# Patient Record
Sex: Female | Born: 1960 | Race: White | Hispanic: No | Marital: Single | Smoking: Current every day smoker
Health system: Southern US, Community
[De-identification: ages and names within clinical notes are randomized; demographics above are authoritative.]

## PROBLEM LIST (undated history)

## (undated) DIAGNOSIS — I252 Old myocardial infarction: Secondary | ICD-10-CM

## (undated) DIAGNOSIS — C73 Malignant neoplasm of thyroid gland: Secondary | ICD-10-CM

## (undated) DIAGNOSIS — E890 Postprocedural hypothyroidism: Secondary | ICD-10-CM

## (undated) DIAGNOSIS — T782XXA Anaphylactic shock, unspecified, initial encounter: Secondary | ICD-10-CM

## (undated) DIAGNOSIS — I219 Acute myocardial infarction, unspecified: Secondary | ICD-10-CM

## (undated) HISTORY — DX: Hypocalcemia: E83.51

## (undated) HISTORY — DX: Old myocardial infarction: I25.2

## (undated) HISTORY — DX: Acute myocardial infarction, unspecified: I21.9

## (undated) HISTORY — DX: Postprocedural hypothyroidism: E89.0

## (undated) HISTORY — DX: Malignant neoplasm of thyroid gland: C73

## (undated) HISTORY — DX: Anaphylactic shock, unspecified, initial encounter: T78.2XXA

---

## 1998-01-18 ENCOUNTER — Other Ambulatory Visit: Admission: RE | Admit: 1998-01-18 | Discharge: 1998-01-18 | Payer: Self-pay | Admitting: Internal Medicine

## 1998-08-28 ENCOUNTER — Emergency Department (HOSPITAL_COMMUNITY): Admission: EM | Admit: 1998-08-28 | Discharge: 1998-08-28 | Payer: Self-pay | Admitting: Emergency Medicine

## 1998-09-10 ENCOUNTER — Encounter: Payer: Self-pay | Admitting: Internal Medicine

## 1998-09-10 ENCOUNTER — Ambulatory Visit (HOSPITAL_COMMUNITY): Admission: RE | Admit: 1998-09-10 | Discharge: 1998-09-10 | Payer: Self-pay | Admitting: Internal Medicine

## 1998-09-11 ENCOUNTER — Encounter: Payer: Self-pay | Admitting: Internal Medicine

## 1998-09-11 ENCOUNTER — Ambulatory Visit (HOSPITAL_COMMUNITY): Admission: RE | Admit: 1998-09-11 | Discharge: 1998-09-11 | Payer: Self-pay | Admitting: Internal Medicine

## 1998-09-18 ENCOUNTER — Other Ambulatory Visit: Admission: RE | Admit: 1998-09-18 | Discharge: 1998-09-18 | Payer: Self-pay | Admitting: Internal Medicine

## 1998-10-26 ENCOUNTER — Encounter: Payer: Self-pay | Admitting: Otolaryngology

## 1998-10-30 ENCOUNTER — Ambulatory Visit (HOSPITAL_COMMUNITY): Admission: RE | Admit: 1998-10-30 | Discharge: 1998-10-31 | Payer: Self-pay | Admitting: Otolaryngology

## 1999-02-01 ENCOUNTER — Emergency Department (HOSPITAL_COMMUNITY): Admission: EM | Admit: 1999-02-01 | Discharge: 1999-02-01 | Payer: Self-pay

## 1999-02-24 ENCOUNTER — Emergency Department (HOSPITAL_COMMUNITY): Admission: EM | Admit: 1999-02-24 | Discharge: 1999-02-24 | Payer: Self-pay | Admitting: *Deleted

## 1999-09-06 ENCOUNTER — Encounter: Payer: Self-pay | Admitting: *Deleted

## 1999-09-06 ENCOUNTER — Encounter: Admission: RE | Admit: 1999-09-06 | Discharge: 1999-09-06 | Payer: Self-pay | Admitting: *Deleted

## 2000-01-18 ENCOUNTER — Emergency Department (HOSPITAL_COMMUNITY): Admission: EM | Admit: 2000-01-18 | Discharge: 2000-01-18 | Payer: Self-pay | Admitting: Emergency Medicine

## 2000-02-10 ENCOUNTER — Encounter: Payer: Self-pay | Admitting: Family Medicine

## 2000-02-10 ENCOUNTER — Encounter: Admission: RE | Admit: 2000-02-10 | Discharge: 2000-02-10 | Payer: Self-pay | Admitting: Family Medicine

## 2000-06-19 ENCOUNTER — Encounter: Payer: Self-pay | Admitting: Emergency Medicine

## 2000-06-19 ENCOUNTER — Emergency Department (HOSPITAL_COMMUNITY): Admission: EM | Admit: 2000-06-19 | Discharge: 2000-06-19 | Payer: Self-pay | Admitting: Emergency Medicine

## 2000-06-23 ENCOUNTER — Emergency Department (HOSPITAL_COMMUNITY): Admission: EM | Admit: 2000-06-23 | Discharge: 2000-06-23 | Payer: Self-pay | Admitting: Emergency Medicine

## 2000-09-16 ENCOUNTER — Emergency Department (HOSPITAL_COMMUNITY): Admission: EM | Admit: 2000-09-16 | Discharge: 2000-09-16 | Payer: Self-pay | Admitting: *Deleted

## 2002-04-05 ENCOUNTER — Encounter: Payer: Self-pay | Admitting: Family Medicine

## 2002-04-05 ENCOUNTER — Ambulatory Visit (HOSPITAL_COMMUNITY): Admission: RE | Admit: 2002-04-05 | Discharge: 2002-04-05 | Payer: Self-pay | Admitting: Family Medicine

## 2002-05-18 ENCOUNTER — Emergency Department (HOSPITAL_COMMUNITY): Admission: EM | Admit: 2002-05-18 | Discharge: 2002-05-18 | Payer: Self-pay | Admitting: Emergency Medicine

## 2003-01-28 ENCOUNTER — Emergency Department (HOSPITAL_COMMUNITY): Admission: EM | Admit: 2003-01-28 | Discharge: 2003-01-28 | Payer: Self-pay | Admitting: Emergency Medicine

## 2003-03-29 ENCOUNTER — Encounter: Admission: RE | Admit: 2003-03-29 | Discharge: 2003-03-29 | Payer: Self-pay | Admitting: Family Medicine

## 2003-03-29 ENCOUNTER — Encounter: Payer: Self-pay | Admitting: Family Medicine

## 2003-04-06 ENCOUNTER — Ambulatory Visit (HOSPITAL_COMMUNITY): Admission: RE | Admit: 2003-04-06 | Discharge: 2003-04-06 | Payer: Self-pay | Admitting: Urology

## 2004-07-24 ENCOUNTER — Emergency Department (HOSPITAL_COMMUNITY): Admission: EM | Admit: 2004-07-24 | Discharge: 2004-07-24 | Payer: Self-pay | Admitting: Emergency Medicine

## 2006-12-27 ENCOUNTER — Emergency Department (HOSPITAL_COMMUNITY): Admission: EM | Admit: 2006-12-27 | Discharge: 2006-12-28 | Payer: Self-pay | Admitting: Emergency Medicine

## 2007-03-01 ENCOUNTER — Emergency Department (HOSPITAL_COMMUNITY): Admission: EM | Admit: 2007-03-01 | Discharge: 2007-03-01 | Payer: Self-pay | Admitting: Emergency Medicine

## 2007-03-09 ENCOUNTER — Emergency Department (HOSPITAL_COMMUNITY): Admission: EM | Admit: 2007-03-09 | Discharge: 2007-03-09 | Payer: Self-pay | Admitting: *Deleted

## 2007-04-13 ENCOUNTER — Encounter: Payer: Self-pay | Admitting: Endocrinology

## 2007-04-13 ENCOUNTER — Ambulatory Visit: Payer: Self-pay | Admitting: Endocrinology

## 2007-04-13 ENCOUNTER — Encounter: Payer: Self-pay | Admitting: *Deleted

## 2007-04-13 DIAGNOSIS — T782XXA Anaphylactic shock, unspecified, initial encounter: Secondary | ICD-10-CM | POA: Insufficient documentation

## 2007-04-13 DIAGNOSIS — E890 Postprocedural hypothyroidism: Secondary | ICD-10-CM

## 2007-04-13 DIAGNOSIS — C73 Malignant neoplasm of thyroid gland: Secondary | ICD-10-CM | POA: Insufficient documentation

## 2007-04-13 HISTORY — DX: Postprocedural hypothyroidism: E89.0

## 2007-04-13 HISTORY — DX: Malignant neoplasm of thyroid gland: C73

## 2007-04-13 HISTORY — DX: Anaphylactic shock, unspecified, initial encounter: T78.2XXA

## 2007-04-13 LAB — CONVERTED CEMR LAB
BUN: 14 mg/dL (ref 6–23)
CO2: 28 meq/L (ref 19–32)
Calcium: 9 mg/dL (ref 8.4–10.5)
Chloride: 101 meq/L (ref 96–112)
Creatinine, Ser: 1 mg/dL (ref 0.4–1.2)
GFR calc Af Amer: 77 mL/min
GFR calc non Af Amer: 64 mL/min
Glucose, Bld: 94 mg/dL (ref 70–99)
Potassium: 3.7 meq/L (ref 3.5–5.1)
Sodium: 137 meq/L (ref 135–145)
TSH: 87.56 microintl units/mL — ABNORMAL HIGH (ref 0.35–5.50)

## 2007-04-20 ENCOUNTER — Encounter: Admission: RE | Admit: 2007-04-20 | Discharge: 2007-04-20 | Payer: Self-pay | Admitting: Endocrinology

## 2007-04-20 ENCOUNTER — Encounter: Payer: Self-pay | Admitting: Endocrinology

## 2007-05-04 ENCOUNTER — Encounter: Admission: RE | Admit: 2007-05-04 | Discharge: 2007-05-04 | Payer: Self-pay | Admitting: Endocrinology

## 2007-05-04 ENCOUNTER — Encounter (INDEPENDENT_AMBULATORY_CARE_PROVIDER_SITE_OTHER): Payer: Self-pay | Admitting: Interventional Radiology

## 2007-05-04 ENCOUNTER — Other Ambulatory Visit: Admission: RE | Admit: 2007-05-04 | Discharge: 2007-05-04 | Payer: Self-pay | Admitting: Interventional Radiology

## 2007-05-10 ENCOUNTER — Encounter (INDEPENDENT_AMBULATORY_CARE_PROVIDER_SITE_OTHER): Payer: Self-pay | Admitting: *Deleted

## 2007-06-03 ENCOUNTER — Encounter: Payer: Self-pay | Admitting: Endocrinology

## 2007-06-14 ENCOUNTER — Encounter (HOSPITAL_COMMUNITY): Admission: RE | Admit: 2007-06-14 | Discharge: 2007-07-08 | Payer: Self-pay | Admitting: Otolaryngology

## 2007-07-16 ENCOUNTER — Encounter (HOSPITAL_COMMUNITY): Admission: RE | Admit: 2007-07-16 | Discharge: 2007-10-14 | Payer: Self-pay | Admitting: Otolaryngology

## 2007-08-13 ENCOUNTER — Encounter: Payer: Self-pay | Admitting: Endocrinology

## 2007-08-13 ENCOUNTER — Encounter (INDEPENDENT_AMBULATORY_CARE_PROVIDER_SITE_OTHER): Payer: Self-pay | Admitting: Otolaryngology

## 2007-08-13 ENCOUNTER — Ambulatory Visit (HOSPITAL_COMMUNITY): Admission: RE | Admit: 2007-08-13 | Discharge: 2007-08-13 | Payer: Self-pay | Admitting: Otolaryngology

## 2007-08-23 ENCOUNTER — Ambulatory Visit: Payer: Self-pay | Admitting: Endocrinology

## 2007-10-07 ENCOUNTER — Encounter: Admission: RE | Admit: 2007-10-07 | Discharge: 2007-10-07 | Payer: Self-pay | Admitting: Family Medicine

## 2007-10-18 ENCOUNTER — Encounter: Admission: RE | Admit: 2007-10-18 | Discharge: 2007-10-18 | Payer: Self-pay | Admitting: Family Medicine

## 2007-11-11 ENCOUNTER — Ambulatory Visit: Payer: Self-pay | Admitting: Endocrinology

## 2007-11-12 ENCOUNTER — Telehealth: Payer: Self-pay | Admitting: Endocrinology

## 2007-11-12 ENCOUNTER — Telehealth (INDEPENDENT_AMBULATORY_CARE_PROVIDER_SITE_OTHER): Payer: Self-pay | Admitting: *Deleted

## 2007-11-12 LAB — CONVERTED CEMR LAB: TSH: 18.69 microintl units/mL — ABNORMAL HIGH (ref 0.35–5.50)

## 2008-01-25 ENCOUNTER — Ambulatory Visit: Payer: Self-pay | Admitting: Endocrinology

## 2008-01-26 LAB — CONVERTED CEMR LAB: TSH: 2.67 microintl units/mL (ref 0.35–5.50)

## 2008-08-03 ENCOUNTER — Encounter: Admission: RE | Admit: 2008-08-03 | Discharge: 2008-08-03 | Payer: Self-pay | Admitting: Family Medicine

## 2009-01-02 ENCOUNTER — Ambulatory Visit: Payer: Self-pay | Admitting: Endocrinology

## 2009-01-02 LAB — CONVERTED CEMR LAB: TSH: 5 microintl units/mL (ref 0.35–5.50)

## 2009-06-15 ENCOUNTER — Emergency Department (HOSPITAL_COMMUNITY): Admission: EM | Admit: 2009-06-15 | Discharge: 2009-06-15 | Payer: Self-pay | Admitting: Emergency Medicine

## 2009-09-30 ENCOUNTER — Emergency Department (HOSPITAL_BASED_OUTPATIENT_CLINIC_OR_DEPARTMENT_OTHER): Admission: EM | Admit: 2009-09-30 | Discharge: 2009-09-30 | Payer: Self-pay | Admitting: Emergency Medicine

## 2010-02-08 ENCOUNTER — Ambulatory Visit: Payer: Self-pay | Admitting: Endocrinology

## 2010-02-08 HISTORY — DX: Hypocalcemia: E83.51

## 2010-02-08 LAB — CONVERTED CEMR LAB: TSH: 0.98 microintl units/mL (ref 0.35–5.50)

## 2010-02-13 LAB — CONVERTED CEMR LAB
Calcium, Total (PTH): 9.5 mg/dL (ref 8.4–10.5)
PTH: 37.5 pg/mL (ref 14.0–72.0)
Thyroglobulin Ab: <20 (ref <40.0)
Thyroperoxidase Ab SerPl-aCnc: 39.8 — ABNORMAL HIGH (ref <35.0)

## 2010-07-30 NOTE — Assessment & Plan Note (Signed)
Summary: F/U APPT/#/CD   Vital Signs:  Patient profile:   50 year old female Height:      63 inches (160.02 cm) Weight:      242.13 pounds (110.06 kg) BMI:     43.05 O2 Sat:      97 % on Room air Temp:     98.5 degrees F (36.94 degrees C) oral Pulse rate:   77 / minute BP sitting:   124 / 82  (left arm) Cuff size:   large  Vitals Entered By: Brenton Grills MA (February 08, 2010 10:54 AM)  O2 Flow:  Room air CC: F/U appt/aj   Referring Provider:  a. ross  CC:  F/U appt/aj.  History of Present Illness: pt has h/o stage-1 papillary adenocarcinoma of the thyroid.  she feels well in general.  denies cramps. she does not notice any nodule at the neck or elsewhere.  Current Medications (verified): 1)  Paxil 30 Mg  Tabs (Paroxetine Hcl) .... Take 1 By Mouth Qd 2)  Synthroid 150 Mcg  Tabs (Levothyroxine Sodium) .... Qd  Allergies (verified): 1)  ! Penicillin 2)  ! 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    Physical Exam  General:  normal appearance.   Neck:  a healed scar is present.  i do not appreciate a nodule in the thyroid or elsewhere in the neck  Additional Exam:  FastTSH                   0.98 uIU/mL  Thyroglobulin Antibody     <20.0 U/mL                  <40.0 Thyroglobulin             <0.2 ng/mL  Parathyroid Hormone       37.5 pg/mL                  14.0-72.0 Calcium                   9.5 mg/dL         Impression & Recommendations:  Problem # 1:  NEOPLASM,  MALIGNANT, THYROID GLAND (ICD-193) stage 1 no evidence of recurrence  Problem # 2:  HYPOTHYROIDISM, POSTSURGICAL (ICD-244.0) needs increased rx in view of #1  Problem # 3:  HYPOCALCEMIA (ICD-275.41) Assessment: Improved  Medications Added to Medication List This Visit: 1)  Synthroid 175 Mcg Tabs (Levothyroxine sodium) .Marland Kitchen.. 1 tab once daily  Other Orders: T-Thyroglobulin Panel (24401, [phone]) T-Parathyroid Hormone, Intact w/ Calcium (03474-25956) TLB-TSH (Thyroid Stimulating Hormone) (84443-TSH) Est. Patient Level III (38756)  Patient Instructions: 1)  return 1 year 2)  blood tests are being ordered for you today.  please call [phone] to hear your test results. 3)  pending the test results, please continue the same medications for now. 4)  (update: i left message on phone-tree:  rx as we discussed) Prescriptions: SYNTHROID 175 MCG TABS (LEVOTHYROXINE SODIUM) 1 tab once daily  #30 x 11   Entered and  Authorized by:   Minus Breeding MD   Signed by:   Minus Breeding MD on 02/10/2010   Method used:   [phone]       [address]       [place], [place]  [address]       Ph: [phone] or [phone]       Fax: [phone]   RxID:   [id number]

## 2010-08-30 ENCOUNTER — Emergency Department (HOSPITAL_BASED_OUTPATIENT_CLINIC_OR_DEPARTMENT_OTHER)
Admission: EM | Admit: 2010-08-30 | Discharge: 2010-08-30 | Disposition: A | Discharge status: Home / Self Care | Payer: BC Managed Care – PPO | Source: Home / Self Care

## 2010-08-30 ENCOUNTER — Observation Stay (HOSPITAL_COMMUNITY)
Admission: EM | Admit: 2010-08-30 | Discharge: 2010-09-01 | Disposition: A | Discharge status: Home / Self Care | Payer: BC Managed Care – PPO | Attending: Family Medicine | Admitting: Family Medicine

## 2010-08-30 ENCOUNTER — Emergency Department (HOSPITAL_BASED_OUTPATIENT_CLINIC_OR_DEPARTMENT_OTHER): Admission: EM | Admit: 2010-08-30 | Payer: BC Managed Care – PPO | Source: Home / Self Care

## 2010-08-30 DIAGNOSIS — T370X5A Adverse effect of sulfonamides, initial encounter: Secondary | ICD-10-CM | POA: Insufficient documentation

## 2010-08-30 DIAGNOSIS — E039 Hypothyroidism, unspecified: Secondary | ICD-10-CM | POA: Insufficient documentation

## 2010-08-30 DIAGNOSIS — R0602 Shortness of breath: Secondary | ICD-10-CM | POA: Insufficient documentation

## 2010-08-30 DIAGNOSIS — Z85850 Personal history of malignant neoplasm of thyroid: Secondary | ICD-10-CM | POA: Insufficient documentation

## 2010-08-30 DIAGNOSIS — T361X5A Adverse effect of cephalosporins and other beta-lactam antibiotics, initial encounter: Secondary | ICD-10-CM | POA: Insufficient documentation

## 2010-08-30 DIAGNOSIS — L02519 Cutaneous abscess of unspecified hand: Secondary | ICD-10-CM | POA: Insufficient documentation

## 2010-08-30 DIAGNOSIS — F17200 Nicotine dependence, unspecified, uncomplicated: Secondary | ICD-10-CM | POA: Insufficient documentation

## 2010-08-30 DIAGNOSIS — L500 Allergic urticaria: Principal | ICD-10-CM | POA: Insufficient documentation

## 2010-08-30 DIAGNOSIS — L03019 Cellulitis of unspecified finger: Secondary | ICD-10-CM | POA: Insufficient documentation

## 2010-08-30 DIAGNOSIS — F411 Generalized anxiety disorder: Secondary | ICD-10-CM | POA: Insufficient documentation

## 2010-08-30 DIAGNOSIS — E785 Hyperlipidemia, unspecified: Secondary | ICD-10-CM | POA: Insufficient documentation

## 2010-08-30 LAB — DIFFERENTIAL
Basophils Absolute: 0 10*3/uL (ref 0.0–0.1)
Basophils Relative: 0 % (ref 0–1)
Eosinophils Absolute: 0 10*3/uL (ref 0.0–0.7)
Eosinophils Relative: 0 % (ref 0–5)
Lymphocytes Relative: 7 % — ABNORMAL LOW (ref 12–46)
Lymphs Abs: 1.1 10*3/uL (ref 0.7–4.0)
Monocytes Absolute: 0.6 10*3/uL (ref 0.1–1.0)
Monocytes Relative: 4 % (ref 3–12)
Neutro Abs: 13 10*3/uL — ABNORMAL HIGH (ref 1.7–7.7)
Neutrophils Relative %: 88 % — ABNORMAL HIGH (ref 43–77)

## 2010-08-30 LAB — CBC
HCT: 41.2 % (ref 36.0–46.0)
Hemoglobin: 13.8 g/dL (ref 12.0–15.0)
MCH: 31.9 pg (ref 26.0–34.0)
MCHC: 33.5 g/dL (ref 30.0–36.0)
MCV: 95.2 fL (ref 78.0–100.0)
Platelets: 201 10*3/uL (ref 150–400)
RBC: 4.33 MIL/uL (ref 3.87–5.11)
RDW: 12.6 % (ref 11.5–15.5)
WBC: 14.8 10*3/uL — ABNORMAL HIGH (ref 4.0–10.5)

## 2010-08-31 LAB — CBC
HCT: 37.3 % (ref 36.0–46.0)
Hemoglobin: 12.4 g/dL (ref 12.0–15.0)
MCH: 31.5 pg (ref 26.0–34.0)
MCHC: 33.2 g/dL (ref 30.0–36.0)
MCV: 94.7 fL (ref 78.0–100.0)
Platelets: 197 10*3/uL (ref 150–400)
RBC: 3.94 MIL/uL (ref 3.87–5.11)
RDW: 12.6 % (ref 11.5–15.5)
WBC: 9.7 10*3/uL (ref 4.0–10.5)

## 2010-08-31 LAB — COMPREHENSIVE METABOLIC PANEL
ALT: 10 U/L (ref 0–35)
AST: 23 U/L (ref 0–37)
Albumin: 2.8 g/dL — ABNORMAL LOW (ref 3.5–5.2)
Alkaline Phosphatase: 82 U/L (ref 39–117)
BUN: 12 mg/dL (ref 6–23)
CO2: 21 mEq/L (ref 19–32)
Calcium: 8.2 mg/dL — ABNORMAL LOW (ref 8.4–10.5)
Chloride: 112 mEq/L (ref 96–112)
Creatinine, Ser: 0.78 mg/dL (ref 0.4–1.2)
GFR calc Af Amer: >60 mL/min (ref >60)
GFR calc non Af Amer: >60 mL/min (ref >60)
Glucose, Bld: 145 mg/dL — ABNORMAL HIGH (ref 70–99)
Potassium: 3.7 mEq/L (ref 3.5–5.1)
Sodium: 141 mEq/L (ref 135–145)
Total Bilirubin: 0.2 mg/dL — ABNORMAL LOW (ref 0.3–1.2)
Total Protein: 6 g/dL (ref 6.0–8.3)

## 2010-08-31 LAB — DIFFERENTIAL
Basophils Absolute: 0 10*3/uL (ref 0.0–0.1)
Basophils Relative: 0 % (ref 0–1)
Eosinophils Absolute: 0 10*3/uL (ref 0.0–0.7)
Eosinophils Relative: 0 % (ref 0–5)
Lymphocytes Relative: 5 % — ABNORMAL LOW (ref 12–46)
Lymphs Abs: 0.5 10*3/uL — ABNORMAL LOW (ref 0.7–4.0)
Monocytes Absolute: 0.1 10*3/uL (ref 0.1–1.0)
Monocytes Relative: 1 % — ABNORMAL LOW (ref 3–12)
Neutro Abs: 9.2 10*3/uL — ABNORMAL HIGH (ref 1.7–7.7)
Neutrophils Relative %: 95 % — ABNORMAL HIGH (ref 43–77)

## 2010-08-31 LAB — BASIC METABOLIC PANEL
BUN: 17 mg/dL (ref 6–23)
CO2: 22 mEq/L (ref 19–32)
Calcium: 8.2 mg/dL — ABNORMAL LOW (ref 8.4–10.5)
Chloride: 109 mEq/L (ref 96–112)
Creatinine, Ser: 0.7 mg/dL (ref 0.4–1.2)
GFR calc Af Amer: >60 mL/min (ref >60)
GFR calc non Af Amer: >60 mL/min (ref >60)
Glucose, Bld: 143 mg/dL — ABNORMAL HIGH (ref 70–99)
Potassium: 3.6 mEq/L (ref 3.5–5.1)
Sodium: 138 mEq/L (ref 135–145)

## 2010-08-31 LAB — SEDIMENTATION RATE: Sed Rate: 32 mm/hr — ABNORMAL HIGH (ref 0–22)

## 2010-08-31 LAB — C-REACTIVE PROTEIN: CRP: 4.3 mg/dL — ABNORMAL HIGH (ref <0.6)

## 2010-09-07 NOTE — Discharge Summary (Signed)
  NAME:  Kristina Hawkins, Kristina Hawkins NO.:  [id number]  MEDICAL RECORD NO.:  [id number]           PATIENT TYPE:  I  LOCATION:  1509                         FACILITY:  [facility]  PHYSICIAN:  Mauro Kaufmann, MD         DATE OF BIRTH:  [birth year]  DATE OF ADMISSION:  08/30/2010 DATE OF DISCHARGE:  09/01/2010                              DISCHARGE SUMMARY   ADMISSION DIAGNOSES: 1. Cellulitis and paronychia. 2. Anxiety. 3. Hypothyroidism.  DISCHARGE DIAGNOSES: 1. Cellulitis, resolved. 2. Paronychia, improving. 3. Severe allergic reaction to BACTRIM and KEFLEX, resolved. 4. Hypothyroidism.  Tests performed in the hospital stay include CRP was 4.3.  The patient had elevated white count of 14,000 on day of admission which came down to 9.7 as of August 31, 2010.  BRIEF HISTORY AND PHYSICAL:  A 50 year old female with a complaint of blister on the middle finger of the right hand.  The blister was lanced at [facility] and she was given Keflex and Bactrim.  The patient took the medicine at about 4 p.m. and then woke up with hives and itching and shortness of breath.  She was given EpiPen and then she received EpiPen by fire department.  The patient's tongue was swollen. She was found to have cellulitis and paronychia.  She was started on intravenous vancomycin and Avelox, and at this time the patient's finger has improved and there is no more abscess noted and the wound is clean. The patient will be discharged home on p.o. Zyvox of 5 more days to complete antibiotics for 7 days.  The patient will follow up with her primary care physician in 1-2 weeks.  Medications on discharge include: 1. Benadryl 25 mg every 6 hours as needed for allergic reaction. 2. EpiPen 0.3 intramuscularly x1 as needed for allergic reaction. 3. Prednisone 20 mg p.o. daily for 3 days, then stop. 4. Zyvox 600 mg p.o. b.i.d. for 5 days. 5. Celebrex 200 mg p.o. twice a day. 6. Levothyroxine 175 mcg 1 tablet  p.o. daily. 7. Norco that 5/325 1-2 tablets by mouth every 4 hours as needed. 8. Paroxetine 40 mg 1 tablet p.o. daily. 9. Prilosec 20 mg 1 capsule p.o. daily.  Follow up with Dr. Romero Belling, the patient's MD, in 1 week.     Mauro Kaufmann, MD     GL/MEDQ  D:  09/01/2010  T:  09/02/2010  Job:  [id number]  cc:   Gregary Signs A. Everardo All, MD C. Duane Lope, M.D.  Electronically Signed by Mauro Kaufmann  on 09/07/2010 12:06:50 PM

## 2010-10-10 NOTE — H&P (Signed)
  NAME:  KUSHI, KUN NO.:  [id number]  MEDICAL RECORD NO.:  [id number]           PATIENT TYPE:  E  LOCATION:  WLED                         FACILITY:  [facility]  PHYSICIAN:  Massie Maroon, MD        DATE OF BIRTH:  02-22-1961  DATE OF ADMISSION:  08/30/2010 DATE OF DISCHARGE:                             HISTORY & PHYSICAL   CHIEF COMPLAINT:  "My finger was swollen."  HISTORY OF PRESENT ILLNESS:  A 50 year old female with complaints of a blister on her second finger right hand.  The patient had blister lanced at the [facility], was treated with Keflex and Bactrim and sent out.  The patient took the medicine and then at about 4:00 p.m., she was woken up hives, itching, and shortness of breath.  She gave herself an EpiPen and then she received a second EpiPen by fire department.  Tongue was swollen.  The patient has received Solu-Medrol, Benadryl, and Pepcid and has improved.  The patient will be admittedobservation for evaluation of this drainage from finger.  PAST MEDICAL HISTORY: 1. Thyroid cancer. 2. Hypothyroidism. 3. Anxiety/depression.  PAST SURGICAL HISTORY:  Thyroidectomy.  SOCIAL HISTORY:  The patient quit smoking 80 days ago.  She was a 1-pack- per-day smoker x30 years.  She does not drink.  FAMILY HISTORY:  Positive for diabetes, hypertension, and breast cancer.  ALLERGIES: 1. PENICILLIN. 2. TETRACYCLINE.  MEDICATIONS: 1. Paxil. 2. Bactrim. 3. Celebrex. 4. Keflex. 5. Synthroid.  REVIEW OF SYSTEMS:  Negative for fever or chills.  Negative for all 10 organ systems except for pertinent positives stated above.  PHYSICAL EXAMINATION:  VITAL SIGNS:  Temperature 97.8, pulse 81, blood pressure is 112/56, and pulse ox 97% on room air. HEENT:  Anicteric. Neck:  No JVD, no bruit. HEART:  Regular rate and rhythm.  S1-S2.  No murmurs, gallops, or rubs. LUNGS:  Clear to auscultation bilaterally. ABDOMEN:  Soft, nontender, and nondistended.   Positive bowel sounds. EXTREMITIES:  No cyanosis, clubbing, or edema. SKIN:  There is a popped blister just proximal to the nail bed second finger right hand.  There is some slight erythema in an oval pattern on the dorsum of the right hand, nontender, no fluctuance.  ASSESSMENT AND PLAN: 1. Cellulitis, paronychia.  Intravenous vancomycin and intravenous     Avelox. 2. Anxiety, continue Paxil. 3. Hypothyroidism.  Continue Synthroid. 4. Disposition:  The patient needs to find the home doses of her     medication.  We can restart those trial once those are available. 5. Tobacco dependence.  The patient was counseled on smoking cessation     for 10 minutes. 6. DVT prophylaxis.  SCDs.     Massie Maroon, MD     JYK/MEDQ  D:  08/31/2010  T:  08/31/2010  Job:  [id number]  cc:   Dr. Gildardo Cranker  Sean A. Everardo All, MD [address] [place] [address]  Electronically Signed by Pearson Grippe MD on 10/10/2010 12:54:23 AM

## 2010-11-12 NOTE — Op Note (Signed)
NAME:  Kristina Hawkins, Kristina Hawkins               ACCOUNT NO.:  [id number]   MEDICAL RECORD NO.:  [id number]          PATIENT TYPE:  AMB   LOCATION:  SDS                          FACILITY:  [facility]   PHYSICIAN:  Jefry H. Pollyann Kennedy, MD     DATE OF BIRTH:  Feb 24, 1961   DATE OF PROCEDURE:  08/13/2007  DATE OF DISCHARGE:  08/13/2007                               OPERATIVE REPORT   PREOPERATIVE DIAGNOSES:  1. Anterior neck mass.  2. History of thyroid carcinoma.   POSTOPERATIVE DIAGNOSES:  1. Anterior neck mass.  2. History of thyroid carcinoma.   PROCEDURE:  Excisional biopsy of anterior neck mass.   SURGEON:  Jefry H. Pollyann Kennedy, MD.   ANESTHESIA:  General endotracheal anesthesia was used.   COMPLICATIONS:  None.   FINDINGS:  A 1.5 cm nodular mass at the midline just anterior to the  thyrohyoid membrane.  No other masses identified.   ESTIMATED BLOOD LOSS:  Minimal.   REFERRING PHYSICIAN:  Sean A. Everardo All, MD.   HISTORY:  This 50 year old lady underwent thyroidectomy about seven  years ago for thyroid carcinoma.  She was found to have a nodule in the  anterior neck recently that on needle aspiration biopsy revealed  follicular cells.  The risks, benefits, alternatives, and complications  of the procedure were explained to the patient, who seemed to understand  and agreed to surgery.   PROCEDURE:  The patient was taken to the operating room and placed on  the operating table in the supine position.  Following induction of  general endotracheal anesthesia using a laryngeal mask airway, the neck  was prepped and draped in the standard fashion.  A horizontal incision  was outlined overlying the skin crease about the level of the hyoid and  a marking pen was used to outline the incision. 1% Xylocaine with  epinephrine was infiltrated into the proposed incision.  A #15 scalpel  was used to incise the skin and subcutaneous tissue.  Electrocautery was  then used to complete the dissection and then a  generous area of  subcutaneous fat was dissected out of this area exposing the underlying  mass.  The mass was then dissected out from surrounding tissue using  electrocautery dissection.  A single #4-0 silk tie was used along the  strap muscles for a small bleeder.  The rest of the hemostasis was  accomplished using electrocautery.  The mass was sent for pathologic  evaluation.  The wound was irrigated with saline and closed in layers  using #4-0 chromic on the subcutaneous layer and deeper fascial layer  and then  running #5-0 nylon on the skin.  A rubber band drain was left in the  wound, exited through the left side of the incision, and was secured in  place with a nylon suture.  Bacitracin and a dressing were applied.  The  patient was then awakened, extubated, and transferred to recovery in  stable condition.      Jefry H. Pollyann Kennedy, MD  Electronically Signed     JHR/MEDQ  D:  08/13/2007  T:  08/14/2007  Job:  [id number]  cc:   Sean A. Everardo All, MD

## 2011-03-10 ENCOUNTER — Other Ambulatory Visit: Payer: Self-pay | Admitting: Endocrinology

## 2011-03-20 LAB — HCG, SERUM, QUALITATIVE: Preg, Serum: NEGATIVE

## 2011-03-21 LAB — CBC
HCT: 39.8
Hemoglobin: 13.9
MCHC: 34.8
MCV: 95.1
Platelets: 192
RBC: 4.18
RDW: 14.5
WBC: 8.6

## 2011-04-04 LAB — HCG, SERUM, QUALITATIVE: Preg, Serum: NEGATIVE

## 2011-05-10 ENCOUNTER — Other Ambulatory Visit: Payer: Self-pay | Admitting: Endocrinology

## 2011-05-31 DIAGNOSIS — I219 Acute myocardial infarction, unspecified: Secondary | ICD-10-CM

## 2011-05-31 HISTORY — DX: Acute myocardial infarction, unspecified: I21.9

## 2011-06-16 ENCOUNTER — Other Ambulatory Visit: Payer: Self-pay | Admitting: Endocrinology

## 2011-06-22 ENCOUNTER — Other Ambulatory Visit: Payer: Self-pay | Admitting: Endocrinology

## 2011-08-06 ENCOUNTER — Other Ambulatory Visit: Payer: Self-pay | Admitting: *Deleted

## 2011-08-06 MED ORDER — LEVOTHYROXINE SODIUM 175 MCG PO TABS: ORAL_TABLET | ORAL | Status: DC

## 2011-08-06 NOTE — Telephone Encounter (Signed)
Pt informed rx sent to pharmacy.

## 2011-08-06 NOTE — Telephone Encounter (Signed)
R'cd call from pt requesting refill of Levothyroxine until appointment on 08/12/2011. Rx sent to [facility], left message for pt to callback office.

## 2011-08-12 ENCOUNTER — Encounter: Payer: Self-pay | Admitting: Endocrinology

## 2011-08-12 ENCOUNTER — Other Ambulatory Visit (INDEPENDENT_AMBULATORY_CARE_PROVIDER_SITE_OTHER): Payer: BC Managed Care – PPO

## 2011-08-12 ENCOUNTER — Ambulatory Visit (INDEPENDENT_AMBULATORY_CARE_PROVIDER_SITE_OTHER): Payer: BC Managed Care – PPO | Admitting: Endocrinology

## 2011-08-12 DIAGNOSIS — E890 Postprocedural hypothyroidism: Secondary | ICD-10-CM

## 2011-08-12 DIAGNOSIS — N209 Urinary calculus, unspecified: Secondary | ICD-10-CM

## 2011-08-12 LAB — MAGNESIUM: Magnesium: 1.8 mg/dL (ref 1.5–2.5)

## 2011-08-12 LAB — TSH: TSH: 9.95 u[IU]/mL — ABNORMAL HIGH (ref 0.35–5.50)

## 2011-08-12 MED ORDER — LEVOTHYROXINE SODIUM 200 MCG PO TABS: 200.0000 ug | ORAL_TABLET | Freq: Every day | ORAL | Status: DC

## 2011-08-12 NOTE — Progress Notes (Signed)
  Subjective:    Patient ID: Kristina Hawkins, female    DOB: 1960/12/07, 51 y.o.   MRN: [id number]  HPI The state of at least three ongoing medical problems is addressed today: in 2000, she had surgery for a nodule in the thyroid.  she had an 8 mm focus of papillary adenocarcinoma in the right lobe.  She says that although the nodule was found to be malignant, she was advised that no further treatment was necessary for this.  In 2008, she presented with a new neck mass.  It was resected in early 2009, but was benign on pathol (chronic thyroiditis).  She has not noticed any other nodule at the neck since then.   Hypothyroidism: Denies weight change Denies cramps.  Past Medical History  Diagnosis Date  . 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 HYPOTHYROIDISM, POSTSURGICAL 04/13/2007  . Hypocalcemia 02/08/2010  . ANAPHYLAXIS 04/13/2007  . MI (myocardial infarction) 05/2011    No past surgical history on file.  History   Social History  . Marital Status: Single    Spouse Name: N/A    Number of Children: N/A  . Years of Education: N/A   Occupational History  . Not on file.   Social History Main Topics  . Smoking status: Current Everyday Smoker -- 0.3 packs/day    Types: Cigarettes  . Smokeless tobacco: Not on file  . Alcohol Use: Not on file  . Drug Use: Not on file  . Sexually Active: Not on file   Other Topics Concern  . Not on file   Social History Narrative   Single. She was recently laid off from job.    No current outpatient prescriptions on file prior to visit.    Allergies  Allergen Reactions  . Penicillins   . Tetracycline     Family History  Problem Relation Age of Onset  .  Thyroid disease Neg Hx     BP 116/82  Pulse 72  Temp(Src) 97.5 F (36.4 C) (Oral)  Ht 5\' 2"  (1.575 m)  Wt 235 lb (106.595 kg)  BMI 42.98 kg/m2  SpO2 97%   Review of Systems Denies neck pain and tremor    Objective:   Physical Exam VITAL SIGNS:  See vs page GENERAL: no distress Neck: 2 healed scars are present.  i do not appreciate a nodule in the thyroid or elsewhere in the neck  Lab Results  Component Value Date   TSH 9.95* 08/12/2011   Lab Results  Component Value Date   PTH 41.2 08/12/2011   CALCIUM 9.3 08/12/2011      Assessment & Plan:  Small focus of thyroid cancer, no f/u needed Hypothyroidism, needs increased rx Hypocalcemia, resolved

## 2011-08-12 NOTE — Patient Instructions (Addendum)
blood tests are being requested for you today.  please call [phone] to hear your test results.  You will be prompted to enter the 9-digit "MRN" number that appears at the top left of this page, followed by #.  Then you will hear the message. Please return in 1 year.  (update: i left message on phone-tree:  Increase synthroid to 200/d).

## 2011-08-13 LAB — PTH, INTACT AND CALCIUM
Calcium, Total (PTH): 9.3 mg/dL (ref 8.4–10.5)
PTH: 41.2 pg/mL (ref 14.0–72.0)

## 2012-08-05 ENCOUNTER — Other Ambulatory Visit: Payer: Self-pay

## 2012-08-06 MED ORDER — LEVOTHYROXINE SODIUM 200 MCG PO TABS: 200.0000 ug | ORAL_TABLET | Freq: Every day | ORAL | Status: DC

## 2012-10-25 ENCOUNTER — Other Ambulatory Visit: Payer: Self-pay | Admitting: Orthopedic Surgery

## 2012-10-25 DIAGNOSIS — M545 Low back pain, unspecified: Secondary | ICD-10-CM

## 2012-10-30 ENCOUNTER — Ambulatory Visit
Admission: RE | Admit: 2012-10-30 | Discharge: 2012-10-30 | Disposition: A | Discharge status: Home / Self Care | Payer: BC Managed Care – PPO | Source: Ambulatory Visit | Attending: Orthopedic Surgery | Admitting: Orthopedic Surgery

## 2012-10-30 DIAGNOSIS — M545 Low back pain, unspecified: Secondary | ICD-10-CM

## 2012-12-02 ENCOUNTER — Other Ambulatory Visit: Payer: Self-pay

## 2012-12-27 ENCOUNTER — Telehealth: Payer: Self-pay | Admitting: Endocrinology

## 2012-12-27 MED ORDER — LEVOTHYROXINE SODIUM 200 MCG PO TABS: 200.0000 ug | ORAL_TABLET | Freq: Every day | ORAL | Status: DC

## 2012-12-27 NOTE — Telephone Encounter (Signed)
Pt needs her syntroid refilled she has an appt on 7/21 cant go that long without it.  Please call her at [phone].[email or web address]

## 2012-12-28 ENCOUNTER — Other Ambulatory Visit: Payer: Self-pay | Admitting: *Deleted

## 2012-12-28 MED ORDER — LEVOTHYROXINE SODIUM 200 MCG PO TABS: 200.0000 ug | ORAL_TABLET | Freq: Every day | ORAL | Status: DC

## 2013-01-17 ENCOUNTER — Encounter: Payer: Self-pay | Admitting: Endocrinology

## 2013-01-17 ENCOUNTER — Ambulatory Visit (INDEPENDENT_AMBULATORY_CARE_PROVIDER_SITE_OTHER): Payer: BC Managed Care – PPO | Admitting: Endocrinology

## 2013-01-17 DIAGNOSIS — E890 Postprocedural hypothyroidism: Secondary | ICD-10-CM

## 2013-01-17 LAB — TSH: TSH: 1.97 u[IU]/mL (ref 0.35–5.50)

## 2013-01-17 NOTE — Progress Notes (Signed)
Subjective:    Patient ID: Kristina Hawkins, female    DOB: 1960/09/15, 52 y.o.   MRN: [id number]  HPI The state of at least three ongoing medical problems is addressed today: in 2000, she had surgery for a nodule in the thyroid.  an 8 mm focus of papillary adenocarcinoma was found in the right lobe.  She says that although the nodule was found to be malignant, she was advised that no further treatment was necessary for this.  In 2008, she presented with a new neck mass.  It was resected in early 2009, but was benign on pathol (chronic thyroiditis).  She has not noticed any other nodule at the neck since then.   Hypothyroidism: she has lost weight, due to her efforts. Past Medical History  Diagnosis Date  . 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 HYPOTHYROIDISM, POSTSURGICAL 04/13/2007  . Hypocalcemia 02/08/2010  . ANAPHYLAXIS 04/13/2007  . MI (myocardial infarction) 05/2011    No past surgical history on file.  History   Social History  . Marital Status: Single    Spouse Name: N/A    Number of Children: N/A  . Years of Education: N/A   Occupational History  . Not on file.   Social History Main Topics  . Smoking status: Current Every Day Smoker -- 0.30 packs/day    Types: Cigarettes  . Smokeless tobacco: Not on file  . Alcohol Use: Not on file  . Drug Use: Not on file  . Sexually Active: Not on file   Other Topics Concern  . Not on file   Social History Narrative   Single. She was recently laid off from job.    Current Outpatient Prescriptions on File Prior to Visit  Medication Sig Dispense Refill  . aspirin 325 MG EC tablet Take 325 mg by mouth daily.      Marland Kitchen atorvastatin (LIPITOR) 80 MG  tablet Take 80 mg by mouth daily.       . carvedilol (COREG) 3.125 MG tablet Take 3.125 mg by mouth 2 (two) times daily with a meal.       . enalapril (VASOTEC) 2.5 MG tablet Take 2.5 mg by mouth 2 (two) times daily.       . furosemide (LASIX) 20 MG tablet Take 20 mg by mouth daily.       Marland Kitchen levothyroxine (SYNTHROID, LEVOTHROID) 200 MCG tablet Take 1 tablet (200 mcg total) by mouth daily.  90 tablet  0  . NITROSTAT 0.3 MG SL tablet Place 0.3 mg under the tongue every 5 (five) minutes as needed.       Marland Kitchen PARoxetine (PAXIL) 40 MG tablet 1 1/2 tablets daily      . prasugrel (EFFIENT) 10 MG TABS Take 10 mg by mouth daily.       No current facility-administered medications on file prior to visit.    Allergies  Allergen Reactions  . Penicillins   . Tetracycline     Family History  Problem Relation Age of Onset  . Thyroid disease Neg Hx     BP 132/76  Pulse 70  Ht 5\' 3"  (1.6 m)  Wt 226 lb (102.513 kg)  BMI 40.04 kg/m2  SpO2 98%  Review of Systems Denies muscle cramps    Objective:  Physical Exam VITAL SIGNS:  See vs page GENERAL: no distress Neck: a healed scar is present.  i do not appreciate a nodule in the thyroid or elsewhere in the neck   Lab Results  Component Value Date   TSH 1.97 01/17/2013      Assessment & Plan:  Postsurgical hypothyroidism, well-replaced H/o small focus of thyroid cancer; no nodule is noted clinically.

## 2013-01-17 NOTE — Patient Instructions (Addendum)
blood tests are being requested for you today.  We'll contact you with results.   Please return in 1 year.   

## 2013-04-07 ENCOUNTER — Other Ambulatory Visit: Payer: Self-pay | Admitting: Cardiology

## 2013-04-18 ENCOUNTER — Other Ambulatory Visit: Payer: Self-pay | Admitting: Endocrinology

## 2013-04-19 ENCOUNTER — Other Ambulatory Visit: Payer: Self-pay | Admitting: *Deleted

## 2013-04-19 MED ORDER — LEVOTHYROXINE SODIUM 200 MCG PO TABS: 200.0000 ug | ORAL_TABLET | Freq: Every day | ORAL | Status: DC

## 2013-06-15 ENCOUNTER — Ambulatory Visit (INDEPENDENT_AMBULATORY_CARE_PROVIDER_SITE_OTHER): Payer: BC Managed Care – PPO | Admitting: Cardiology

## 2013-06-15 ENCOUNTER — Encounter: Payer: Self-pay | Admitting: Cardiology

## 2013-06-15 VITALS — BP 147/89 | HR 74 | Ht 63.0 in | Wt 222.0 lb

## 2013-06-15 DIAGNOSIS — Z87892 Personal history of anaphylaxis: Secondary | ICD-10-CM

## 2013-06-15 DIAGNOSIS — I252 Old myocardial infarction: Secondary | ICD-10-CM

## 2013-06-15 DIAGNOSIS — I25810 Atherosclerosis of coronary artery bypass graft(s) without angina pectoris: Secondary | ICD-10-CM

## 2013-06-15 DIAGNOSIS — I10 Essential (primary) hypertension: Secondary | ICD-10-CM

## 2013-06-15 DIAGNOSIS — I2510 Atherosclerotic heart disease of native coronary artery without angina pectoris: Secondary | ICD-10-CM | POA: Insufficient documentation

## 2013-06-15 HISTORY — DX: Old myocardial infarction: I25.2

## 2013-06-15 NOTE — Progress Notes (Signed)
[address] [place], [place]  [address] Phone: [phone] Fax:  [phone]  Date:  06/15/2013   ID:  [name], DOB [birth year], MRN [id number]  PCP:   Duane Lope, MD   History of Present Illness: Kristina Hawkins is a 52 y.o. female with a hx of MI and coronary artery disease status post acute myocardial infarction on Jun 29, 2011. Cardiac catheterization revealed high grade obstruction in the mid and distal right coronary artery. [facility]. She had mild nonobstructive disease in the left system. LV function was preserved. She underwent successful drug eluting stent placement in both lesions in the RCA.  She has had recurrent ongoing episodes of anaphylaxis, last episode is quite severe requiring intubation. 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 I did however discontinue her losartan, angiotensin receptor blocker, which has a very low risk of anaphylaxis/angioedema (0.01%). nonetheless, I feel more comfortable about her being on this medication. I replaced it with amlodipine 5 mg. Her daughter was present during the episode, her daughter is a Radiation protection practitioner.  I stopped her ACE inhibitor and angiotensin receptor blocker because of potential anaphylactic reaction. 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  She was concerned about potential blood clot in her right leg. She describes her pain on her anterior by around her knee. Reassured her. She's been having more back pain, sciatica.    Wt Readings from Last 3 Encounters:  06/15/13 222 lb (100.699 kg)  01/17/13 226 lb (102.513 kg)  08/12/11 235 lb (106.595 kg)     Past Medical History  Diagnosis Date  . NEOPLASM, MALIGNANT, THYROID GLAND 04/13/2007    5/08-thyroidectomy--had 8mm focus of papillary adenocarcinoma right lob 10/08-presented with ant neck nodule 12/08-scan show uptake at ant neck 1/09-156 mci of I-131 rx 2/09-resection of ant neck  nodule-lymphycotic thyroiditis, no cancer 8/11-thyroglobulin undetected (ab neg)  . HYPOTHYROIDISM, POSTSURGICAL 04/13/2007  . Hypocalcemia 02/08/2010  . ANAPHYLAXIS 04/13/2007  . MI (myocardial infarction) 05/2011    No past surgical history on file.  Current Outpatient Prescriptions  Medication Sig Dispense Refill  . amLODipine (NORVASC) 5 MG tablet 1 tab daily      . aspirin 325 MG EC tablet Take 325 mg by mouth daily.      Marland Kitchen atorvastatin (LIPITOR) 80 MG tablet Take 80 mg by mouth daily.       . carvedilol (COREG) 6.25 MG tablet TAKE 1 TABLET WITH FOOD TWICE A DAY ORALLY  60 tablet  10  . cetirizine (ZYRTEC) 10 MG tablet 1 tab daily      . chlorzoxazone (PARAFON) 500 MG tablet 1 tab tid      . fluconazole (DIFLUCAN) 150 MG tablet       . furosemide (LASIX) 20 MG tablet Take 20 mg by mouth daily.       Marland Kitchen gabapentin (NEURONTIN) 300 MG capsule Take 3 tabs daily      . HYDROcodone-acetaminophen (NORCO) 7.5-325 MG per tablet Take as directed      . KLOR-CON M20 20 MEQ tablet 1 tab daily      . levothyroxine (SYNTHROID, LEVOTHROID) 200 MCG tablet Take 1 tablet (200 mcg total) by mouth daily.  90 tablet  1  . montelukast (SINGULAIR) 10 MG tablet Take 1 tab a day      . NITROSTAT 0.3 MG SL tablet Place 0.3 mg under the tongue every 5 (  five) minutes as needed.       . nystatin (MYCOSTATIN/NYSTOP) 100000 UNIT/GM POWD       . omeprazole (PRILOSEC OTC) 20 MG tablet Take 20 mg by mouth daily.      Marland Kitchen PARoxetine (PAXIL) 40 MG tablet 1 1/2 tablets daily      . prasugrel (EFFIENT) 10 MG TABS Take 10 mg by mouth daily.       No current facility-administered medications for this visit.    Allergies:    Allergies  Allergen Reactions  . Penicillins   . Tetracycline     Social History:  The patient  reports that she has been smoking Cigarettes.  She has been smoking about 0.30 packs per day. She does not have any smokeless tobacco history on file.   ROS:  Please see the history of present  illness.   No bleeding, no syncope, no orthopnea, no PND    PHYSICAL EXAM: VS:  BP 147/89  Pulse 74  Ht 5\' 3"  (1.6 m)  Wt 222 lb (100.699 kg)  BMI 39.34 kg/m2 Well nourished, well developed, in no acute distress HEENT: normal Neck: no JVD Cardiac:  normal S1, S2; RRR; no murmur Lungs:  clear to auscultation bilaterally, no wheezing, rhonchi or rales Abd: soft, nontender, no hepatomegalyoverweight Ext: no edemanegative Homans sign Skin: warm and dry Neuro: no focal abnormalities noted  EKG:  Sinus rhythm rate 74 with no other abnormalities.     ASSESSMENT AND PLAN:  1. Coronary artery disease-prior myocardial infarction in December of 2012, RCA stenting performed at [facility]. Continue with dual antiplatelet therapy. No evidence of bleeding. 2. Old myocardial infarction-December 2012. No anginal symptoms. 3. Obesity-encourage weight loss. Back pain is limiting. Decrease carbohydrates. 4. HTN - after stopping her enalapril/ARB, amlodipine seems to be doing a fairly good job. Continue to monitor. I would like ultimately for her blood pressure to be less than 140 systolic. 5. Prior anaphylaxis-avoiding angiotensin receptor blockers.  Signed, Donato Schultz, MD [facility]  06/15/2013 4:41 PM

## 2013-06-15 NOTE — Patient Instructions (Signed)
Your physician recommends that you continue on your current medications as directed. Please refer to the Current Medication list given to you today.  Your physician wants you to follow-up in: 1 year with Dr. Skains. You will receive a reminder letter in the mail two months in advance. If you don't receive a letter, please call our office to schedule the follow-up appointment.  

## 2013-08-16 ENCOUNTER — Other Ambulatory Visit: Payer: Self-pay

## 2013-08-16 MED ORDER — PRASUGREL HCL 10 MG PO TABS: 10.0000 mg | ORAL_TABLET | Freq: Every day | ORAL | Status: DC

## 2013-09-02 ENCOUNTER — Other Ambulatory Visit: Payer: Self-pay

## 2013-09-02 MED ORDER — FUROSEMIDE 20 MG PO TABS: 20.0000 mg | ORAL_TABLET | Freq: Every day | ORAL | Status: DC

## 2013-09-02 MED ORDER — ATORVASTATIN CALCIUM 80 MG PO TABS: 80.0000 mg | ORAL_TABLET | Freq: Every day | ORAL | Status: DC

## 2013-09-29 ENCOUNTER — Other Ambulatory Visit: Payer: Self-pay | Admitting: *Deleted

## 2013-09-29 DIAGNOSIS — E7800 Pure hypercholesterolemia, unspecified: Secondary | ICD-10-CM

## 2013-10-03 ENCOUNTER — Other Ambulatory Visit: Payer: Self-pay | Admitting: *Deleted

## 2013-10-03 ENCOUNTER — Other Ambulatory Visit: Payer: BC Managed Care – PPO

## 2013-10-03 MED ORDER — AMLODIPINE BESYLATE 5 MG PO TABS: 5.0000 mg | ORAL_TABLET | Freq: Every day | ORAL | Status: DC

## 2013-10-14 ENCOUNTER — Other Ambulatory Visit: Payer: Self-pay | Admitting: Endocrinology

## 2014-01-17 ENCOUNTER — Ambulatory Visit: Payer: BC Managed Care – PPO | Admitting: Endocrinology

## 2014-05-15 ENCOUNTER — Other Ambulatory Visit: Payer: Self-pay | Admitting: Cardiology

## 2014-06-12 ENCOUNTER — Other Ambulatory Visit: Payer: Self-pay | Admitting: Cardiology

## 2014-07-17 ENCOUNTER — Other Ambulatory Visit: Payer: Self-pay | Admitting: Cardiology

## 2014-08-14 ENCOUNTER — Other Ambulatory Visit: Payer: Self-pay | Admitting: Cardiology

## 2014-08-14 NOTE — Telephone Encounter (Signed)
NEEDS APPT WITH SKAINS OR APP FOR REFILLS

## 2014-09-18 ENCOUNTER — Other Ambulatory Visit: Payer: Self-pay | Admitting: Adult Health

## 2014-09-30 ENCOUNTER — Other Ambulatory Visit: Payer: Self-pay | Admitting: Cardiology

## 2014-11-19 ENCOUNTER — Other Ambulatory Visit: Payer: Self-pay | Admitting: Adult Health

## 2014-12-28 ENCOUNTER — Other Ambulatory Visit: Payer: Self-pay | Admitting: Adult Health

## 2015-03-24 ENCOUNTER — Other Ambulatory Visit: Payer: Self-pay | Admitting: Adult Health

## 2015-03-26 NOTE — Telephone Encounter (Signed)
Skains Pt

## 2015-06-17 ENCOUNTER — Other Ambulatory Visit: Payer: Self-pay | Admitting: Adult Health

## 2015-07-01 ENCOUNTER — Other Ambulatory Visit: Payer: Self-pay | Admitting: Adult Health

## 2015-07-10 ENCOUNTER — Other Ambulatory Visit: Payer: Self-pay | Admitting: Adult Health

## 2016-04-10 ENCOUNTER — Encounter (HOSPITAL_COMMUNITY): Payer: Self-pay | Admitting: Emergency Medicine

## 2016-04-10 ENCOUNTER — Emergency Department (HOSPITAL_COMMUNITY): Payer: BLUE CROSS/BLUE SHIELD

## 2016-04-10 ENCOUNTER — Emergency Department (HOSPITAL_COMMUNITY)
Admission: EM | Admit: 2016-04-10 | Discharge: 2016-04-10 | Disposition: A | Discharge status: Home / Self Care | Payer: BLUE CROSS/BLUE SHIELD | Attending: Emergency Medicine | Admitting: Emergency Medicine

## 2016-04-10 DIAGNOSIS — I10 Essential (primary) hypertension: Secondary | ICD-10-CM | POA: Diagnosis not present

## 2016-04-10 DIAGNOSIS — Z85850 Personal history of malignant neoplasm of thyroid: Secondary | ICD-10-CM | POA: Diagnosis not present

## 2016-04-10 DIAGNOSIS — Z79899 Other long term (current) drug therapy: Secondary | ICD-10-CM | POA: Diagnosis not present

## 2016-04-10 DIAGNOSIS — I2510 Atherosclerotic heart disease of native coronary artery without angina pectoris: Secondary | ICD-10-CM | POA: Insufficient documentation

## 2016-04-10 DIAGNOSIS — R4781 Slurred speech: Secondary | ICD-10-CM | POA: Insufficient documentation

## 2016-04-10 DIAGNOSIS — N3000 Acute cystitis without hematuria: Secondary | ICD-10-CM | POA: Insufficient documentation

## 2016-04-10 DIAGNOSIS — I252 Old myocardial infarction: Secondary | ICD-10-CM | POA: Diagnosis not present

## 2016-04-10 DIAGNOSIS — R4701 Aphasia: Secondary | ICD-10-CM | POA: Diagnosis present

## 2016-04-10 DIAGNOSIS — F17210 Nicotine dependence, cigarettes, uncomplicated: Secondary | ICD-10-CM | POA: Diagnosis not present

## 2016-04-10 DIAGNOSIS — Z7982 Long term (current) use of aspirin: Secondary | ICD-10-CM | POA: Insufficient documentation

## 2016-04-10 LAB — URINE MICROSCOPIC-ADD ON

## 2016-04-10 LAB — URINALYSIS, ROUTINE W REFLEX MICROSCOPIC
Bilirubin Urine: NEGATIVE
Glucose, UA: NEGATIVE mg/dL
Ketones, ur: NEGATIVE mg/dL
Nitrite: NEGATIVE
Protein, ur: NEGATIVE mg/dL
Specific Gravity, Urine: 1.009 (ref 1.005–1.030)
pH: 6 (ref 5.0–8.0)

## 2016-04-10 LAB — BASIC METABOLIC PANEL
Anion gap: 12 (ref 5–15)
BUN: 10 mg/dL (ref 6–20)
CO2: 28 mmol/L (ref 22–32)
Calcium: 9.1 mg/dL (ref 8.9–10.3)
Chloride: 99 mmol/L — ABNORMAL LOW (ref 101–111)
Creatinine, Ser: 1.12 mg/dL — ABNORMAL HIGH (ref 0.44–1.00)
GFR calc Af Amer: >60 mL/min (ref >60)
GFR calc non Af Amer: 55 mL/min — ABNORMAL LOW (ref >60)
Glucose, Bld: 107 mg/dL — ABNORMAL HIGH (ref 65–99)
Potassium: 3.4 mmol/L — ABNORMAL LOW (ref 3.5–5.1)
Sodium: 139 mmol/L (ref 135–145)

## 2016-04-10 LAB — CBC
HCT: 45.6 % (ref 36.0–46.0)
Hemoglobin: 15.8 g/dL — ABNORMAL HIGH (ref 12.0–15.0)
MCH: 33.1 pg (ref 26.0–34.0)
MCHC: 34.6 g/dL (ref 30.0–36.0)
MCV: 95.4 fL (ref 78.0–100.0)
Platelets: 211 10*3/uL (ref 150–400)
RBC: 4.78 MIL/uL (ref 3.87–5.11)
RDW: 13.8 % (ref 11.5–15.5)
WBC: 8.6 10*3/uL (ref 4.0–10.5)

## 2016-04-10 LAB — CBG MONITORING, ED: Glucose-Capillary: 96 mg/dL (ref 65–99)

## 2016-04-10 LAB — ETHANOL: Alcohol, Ethyl (B): <5 mg/dL (ref <5)

## 2016-04-10 MED ORDER — SULFAMETHOXAZOLE-TRIMETHOPRIM 800-160 MG PO TABS
1.0000 | ORAL_TABLET | Freq: Once | ORAL | Status: AC
  Administered 2016-04-10: 1 via ORAL
  Filled 2016-04-10: qty 1

## 2016-04-10 MED ORDER — ACETAMINOPHEN 325 MG PO TABS
650.0000 mg | ORAL_TABLET | Freq: Once | ORAL | Status: AC
  Administered 2016-04-10: 650 mg via ORAL
  Filled 2016-04-10: qty 2

## 2016-04-10 MED ORDER — SULFAMETHOXAZOLE-TRIMETHOPRIM 800-160 MG PO TABS: 1.0000 | ORAL_TABLET | Freq: Two times a day (BID) | ORAL | 0 refills | Status: AC

## 2016-04-10 NOTE — ED Provider Notes (Signed)
[facility] DEPT Provider Note   CSN: [id number] Arrival date & time: 04/10/16  1323     History   Chief Complaint Chief Complaint  Patient presents with  . Aphasia  . Headache    HPI Kristina Hawkins is a 55 y.o. female. She presents with possibility of some slurred speech.  Prior strokes. Stay she was evaluated several years ago with an episode of heat exhaustion where she was told she had a slight abnormality on her CT scan. She is otherwise normal state of health. She states she literally finished working 27 days in row on Friday and that she is "exhausted". She was sleeping and off her feet a lot this weekend. She states her daughter Telling her that her speech was slurred. She assumed that it was just that she was tired. Her daughters been consistent and persistent about her being evaluated. Patient finally relented today and presents here for evaluation.  She has developed a headache and became more concerned about her symptoms and presents here. Otherwise review systems is negative. She has no numbness or weakness. No vision changes. No blurring or doubling. No difficulty speech or swallowing or hearing. No vermilion gait. Has had some urinary frequency.   history of previous MI. Comply with cholesterol, blood pressure medications as well as her Effient.     HPI  Past Medical History:  Diagnosis Date  . ANAPHYLAXIS 04/13/2007  . Hypocalcemia 02/08/2010  . HYPOTHYROIDISM, POSTSURGICAL 04/13/2007  . MI (myocardial infarction) 05/2011  . 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 Old MI (myocardial infarction) 06/15/2013    Patient Active Problem List   Diagnosis Date Noted  . Coronary  atherosclerosis of native coronary artery 06/15/2013  . Old MI (myocardial infarction) 06/15/2013  . HTN (hypertension) 06/15/2013  . Urolithiasis 08/12/2011  . Hypocalcemia 02/08/2010  . NEOPLASM, MALIGNANT, THYROID GLAND 04/13/2007  . HYPOTHYROIDISM, POSTSURGICAL 04/13/2007  . ANAPHYLAXIS 04/13/2007    History reviewed. No pertinent surgical history.  OB History    No data available       Home Medications    Prior to Admission medications   Medication Sig Start Date End Date Taking? Authorizing Provider  amLODipine (NORVASC) 5 MG tablet TAKE 1 TABLET (5 MG TOTAL) BY MOUTH DAILY. 1 TAB DAILY 07/04/15   Jerline Pain, MD  amLODipine (NORVASC) 5 MG tablet TAKE 1 TABLET (5 MG TOTAL) BY MOUTH DAILY. 1 TAB DAILY 07/10/15   Jerline Pain, MD  aspirin 325 MG EC tablet Take 325 mg by mouth daily.    Historical Provider, MD  atorvastatin (LIPITOR) 80 MG tablet TAKE 1 TABLET (80 MG TOTAL) BY MOUTH DAILY. 10/02/14   Jerline Pain, MD  carvedilol (COREG) 6.25 MG tablet TAKE 1 TABLET WITH FOOD TWICE A DAY ORALLY 04/07/13   Jerline Pain, MD  cetirizine (ZYRTEC) 10 MG tablet 1 tab daily 05/18/13   Historical Provider, MD  chlorzoxazone (PARAFON) 500 MG tablet 1 tab tid 04/19/13   Historical Provider, MD  EFFIENT 10 MG TABS tablet TAKE 1 TABLET (10 MG TOTAL) BY MOUTH DAILY. 08/14/14   Lendon Colonel, NP  fluconazole (DIFLUCAN) 150 MG tablet  01/10/13   Historical Provider, MD  furosemide (LASIX) 20 MG tablet TAKE 1 TABLET (20 MG TOTAL)  BY MOUTH DAILY. 03/26/15   Jerline Pain, MD  gabapentin (NEURONTIN) 300 MG capsule Take 3 tabs daily 04/06/13   Historical Provider, MD  HYDROcodone-acetaminophen (NORCO) 7.5-325 MG per tablet Take as directed 05/18/13   Historical Provider, MD  KLOR-CON M20 20 MEQ tablet 1 tab daily 06/13/13   Historical Provider, MD  levothyroxine (SYNTHROID, LEVOTHROID) 200 MCG tablet TAKE 1 TABLET (200 MCG TOTAL) BY MOUTH DAILY. 10/14/13   Renato Shin, MD  montelukast (SINGULAIR)  10 MG tablet Take 1 tab a day 06/13/13   Historical Provider, MD  NITROSTAT 0.3 MG SL tablet Place 0.3 mg under the tongue every 5 (five) minutes as needed.  07/01/11   Historical Provider, MD  nystatin (MYCOSTATIN/NYSTOP) 100000 UNIT/GM POWD  01/10/13   Historical Provider, MD  omeprazole (PRILOSEC OTC) 20 MG tablet Take 20 mg by mouth daily.    Historical Provider, MD  PARoxetine (PAXIL) 40 MG tablet 1 1/2 tablets daily 07/10/11   Historical Provider, MD    Family History Family History  Problem Relation Age of Onset  . Thyroid disease Neg Hx     Social History Social History  Substance Use Topics  . Smoking status: Current Every Day Smoker    Packs/day: 0.30    Types: Cigarettes  . Smokeless tobacco: Not on file  . Alcohol use Not on file     Allergies   Penicillins and Tetracycline   Review of Systems Review of Systems  Constitutional: Negative for appetite change, chills, diaphoresis, fatigue and fever.  HENT: Negative for mouth sores, sore throat and trouble swallowing.   Eyes: Negative for visual disturbance.  Respiratory: Negative for cough, chest tightness, shortness of breath and wheezing.   Cardiovascular: Negative for chest pain.  Gastrointestinal: Negative for abdominal distention, abdominal pain, diarrhea, nausea and vomiting.  Endocrine: Negative for polydipsia, polyphagia and polyuria.  Genitourinary: Positive for frequency. Negative for dysuria and hematuria.  Musculoskeletal: Negative for gait problem.  Skin: Negative for color change, pallor and rash.  Neurological: Negative for dizziness, syncope, light-headedness and headaches.  Hematological: Does not bruise/bleed easily.  Psychiatric/Behavioral: Negative for behavioral problems and confusion.     Physical Exam Updated Vital Signs BP 124/84   Pulse 64   Temp 98.1 F (36.7 C) (Oral)   Resp 11   Ht 5\' 2"  (1.575 m)   Wt 230 lb (104.3 kg)   SpO2 97%   BMI 42.07 kg/m   Physical Exam    Constitutional: She is oriented to person, place, and time. She appears well-developed and well-nourished. No distress.  HENT:  Head: Normocephalic.  Eyes: Conjunctivae are normal. Pupils are equal, round, and reactive to light. No scleral icterus.  Neck: Normal range of motion. Neck supple. No thyromegaly present.  Cardiovascular: Normal rate and regular rhythm.  Exam reveals no gallop and no friction rub.   No murmur heard. Pulmonary/Chest: Effort normal and breath sounds normal. No respiratory distress. She has no wheezes. She has no rales.  Abdominal: Soft. Bowel sounds are normal. She exhibits no distension. There is no tenderness. There is no rebound.  Musculoskeletal: Normal range of motion.  Neurological: She is alert and oriented to person, place, and time.  Symmetric cranial nerves. At times the daughter will point at the right corner the patient's mouth and state "see if moving different". Patient has symmetric smile normal strength to expression with her face upper and lower. Otherwise intact and symmetric cranial nerves. No pronator drift. No leg drift.  Skin: Skin  is warm and dry. No rash noted.  Psychiatric: She has a normal mood and affect. Her behavior is normal.     ED Treatments / Results  Labs (all labs ordered are listed, but only abnormal results are displayed) Labs Reviewed  BASIC METABOLIC PANEL - Abnormal; Notable for the following:       Result Value   Potassium 3.4 (*)    Chloride 99 (*)    Glucose, Bld 107 (*)    Creatinine, Ser 1.12 (*)    GFR calc non Af Amer 55 (*)    All other components within normal limits  CBC - Abnormal; Notable for the following:    Hemoglobin 15.8 (*)    All other components within normal limits  URINALYSIS, ROUTINE W REFLEX MICROSCOPIC (NOT AT [facility]) - Abnormal; Notable for the following:    APPearance CLOUDY (*)    Hgb urine dipstick SMALL (*)    Leukocytes, UA LARGE (*)    All other components within normal limits  URINE  MICROSCOPIC-ADD ON - Abnormal; Notable for the following:    Squamous Epithelial / LPF 6-30 (*)    Bacteria, UA FEW (*)    All other components within normal limits  URINE CULTURE  ETHANOL  CBG MONITORING, ED    EKG  EKG Interpretation  Date/Time:  Thursday April 10 2016 13:40:54 EDT Ventricular Rate:  67 PR Interval:  202 QRS Duration: 86 QT Interval:  372 QTC Calculation: 393 R Axis:   41 Text Interpretation:  Normal sinus rhythm Low voltage QRS Cannot rule out Anterior infarct , age undetermined T wave abnormality, consider inferolateral ischemia Abnormal ECG Confirmed by Jeneen Rinks  MD, [place] ([id number]) on 04/10/2016 1:44:54 PM       Radiology Ct Head Wo Contrast  Result Date: 04/10/2016 CLINICAL DATA:  Frontal headache and speech difficulty for 3 days. High blood pressure and history of myocardial infarction. EXAM: CT HEAD WITHOUT CONTRAST TECHNIQUE: Contiguous axial images were obtained from the base of the skull through the vertex without intravenous contrast. COMPARISON:  06/15/2009 FINDINGS: Brain: The ventricles are normal in size and configuration. No extra-axial fluid collections are identified. The gray-white differentiation is normal. No CT findings for acute intracranial process such as hemorrhage or infarction. No mass lesions. The brainstem and cerebellum are grossly normal. Vascular: Stable scattered vascular calcifications but no definite aneurysm or hyperdense vessels. Skull: No skull fracture or bone lesion. Sinuses/Orbits: The paranasal sinuses and mastoid air cells are clear except for minimal areas of mucoperiosteal thickening involving the ethmoid air cells and left maxillary sinus. The globes are intact. Other: No scalp lesions or hematoma. IMPRESSION: No acute intracranial findings or mass lesion. Electronically Signed   By: Marijo Sanes M.D.   On: 04/10/2016 15:21    Procedures Procedures (including critical care time)  Medications Ordered in ED Medications    sulfamethoxazole-trimethoprim (BACTRIM DS,SEPTRA DS) 800-160 MG per tablet 1 tablet (not administered)     Initial Impression / Assessment and Plan / ED Course  I have reviewed the triage vital signs and the nursing notes.  Pertinent labs & imaging results that were available during my care of the patient were reviewed by me and considered in my medical decision making (see chart for details).  Clinical Course   Urine appears infected head CT is normal. Remainder of her labs are reassuring. MRI requested. MRI is normal her symptoms could be splint with fatigue and UTI. Unit dose of Bactrim. We'll treat with Bactrim as outpatient  MRI is normal. I see no abnormalities on MRI we will pursue.   Final Clinical Impressions(s) / ED Diagnoses   Final diagnoses:  Slurred speech  Acute cystitis without hematuria    New Prescriptions New Prescriptions   No medications on file     Tanna Furry, MD 04/10/16 1532

## 2016-04-10 NOTE — ED Notes (Signed)
CBG was 96

## 2016-04-10 NOTE — ED Notes (Signed)
Patient transported to MRI 

## 2016-04-10 NOTE — ED Notes (Signed)
Pt is requesting medication for her head.

## 2016-04-10 NOTE — ED Triage Notes (Signed)
Pt states she has been having a headache and notice her speech was different for the last 3 days. Pt states last night her daughter told her "speech sounded slurred". [facility] and was sent here to rule out CVA. Pt is alert ox4. Equal grip strengths, no weakness or numbness noted on NIH. Pt states per her normal speech is different and "head feels funny".

## 2016-04-14 LAB — URINE CULTURE: Culture: 60000 — AB

## 2016-04-15 ENCOUNTER — Telehealth (HOSPITAL_BASED_OUTPATIENT_CLINIC_OR_DEPARTMENT_OTHER): Payer: Self-pay | Admitting: Emergency Medicine

## 2016-04-15 NOTE — Telephone Encounter (Signed)
Post ED Visit - Positive Culture Follow-up  Culture report reviewed by antimicrobial stewardship pharmacist:  []  Elenor Quinones, Pharm.D. []  Heide Guile, Pharm.D., BCPS []  Parks Neptune, Pharm.D. []  Alycia Rossetti, Pharm.D., BCPS []  [place], Pharm.D., BCPS, AAHIVP []  Legrand Como, Pharm.D., BCPS, AAHIVP []  Milus Glazier, Pharm.D. []  Stephens November, Pharm.D. Gwenlyn Perking PharmD  Positive urine culture Treated with bactrim DS, organism sensitive to the same and no further patient follow-up is required at this time.  Hazle Nordmann 04/15/2016, 10:20 AM

## 2016-07-31 DIAGNOSIS — B349 Viral infection, unspecified: Secondary | ICD-10-CM | POA: Diagnosis not present

## 2016-07-31 DIAGNOSIS — R05 Cough: Secondary | ICD-10-CM | POA: Diagnosis not present

## 2016-12-22 DIAGNOSIS — R300 Dysuria: Secondary | ICD-10-CM | POA: Diagnosis not present

## 2016-12-22 DIAGNOSIS — E039 Hypothyroidism, unspecified: Secondary | ICD-10-CM | POA: Diagnosis not present

## 2016-12-22 DIAGNOSIS — F410 Panic disorder [episodic paroxysmal anxiety] without agoraphobia: Secondary | ICD-10-CM | POA: Diagnosis not present

## 2016-12-22 DIAGNOSIS — I10 Essential (primary) hypertension: Secondary | ICD-10-CM | POA: Diagnosis not present

## 2018-03-02 DIAGNOSIS — E7800 Pure hypercholesterolemia, unspecified: Secondary | ICD-10-CM | POA: Diagnosis not present

## 2018-03-02 DIAGNOSIS — I10 Essential (primary) hypertension: Secondary | ICD-10-CM | POA: Diagnosis not present

## 2018-03-02 DIAGNOSIS — I2510 Atherosclerotic heart disease of native coronary artery without angina pectoris: Secondary | ICD-10-CM | POA: Diagnosis not present

## 2018-03-02 DIAGNOSIS — R5383 Other fatigue: Secondary | ICD-10-CM | POA: Diagnosis not present

## 2018-03-02 DIAGNOSIS — E039 Hypothyroidism, unspecified: Secondary | ICD-10-CM | POA: Diagnosis not present

## 2019-08-17 DIAGNOSIS — E7800 Pure hypercholesterolemia, unspecified: Secondary | ICD-10-CM | POA: Diagnosis not present

## 2019-08-17 DIAGNOSIS — K219 Gastro-esophageal reflux disease without esophagitis: Secondary | ICD-10-CM | POA: Diagnosis not present

## 2019-08-17 DIAGNOSIS — I10 Essential (primary) hypertension: Secondary | ICD-10-CM | POA: Diagnosis not present

## 2019-08-17 DIAGNOSIS — E039 Hypothyroidism, unspecified: Secondary | ICD-10-CM | POA: Diagnosis not present

## 2019-09-22 DIAGNOSIS — Z20822 Contact with and (suspected) exposure to covid-19: Secondary | ICD-10-CM | POA: Diagnosis not present

## 2019-09-22 DIAGNOSIS — R05 Cough: Secondary | ICD-10-CM | POA: Diagnosis not present

## 2019-09-22 DIAGNOSIS — R519 Headache, unspecified: Secondary | ICD-10-CM | POA: Diagnosis not present

## 2019-10-01 DIAGNOSIS — Z20822 Contact with and (suspected) exposure to covid-19: Secondary | ICD-10-CM | POA: Diagnosis not present

## 2019-10-01 DIAGNOSIS — J069 Acute upper respiratory infection, unspecified: Secondary | ICD-10-CM | POA: Diagnosis not present

## 2020-02-09 ENCOUNTER — Emergency Department (HOSPITAL_BASED_OUTPATIENT_CLINIC_OR_DEPARTMENT_OTHER): Payer: Worker's Compensation

## 2020-02-09 ENCOUNTER — Emergency Department (HOSPITAL_BASED_OUTPATIENT_CLINIC_OR_DEPARTMENT_OTHER)
Admission: EM | Admit: 2020-02-09 | Discharge: 2020-02-10 | Disposition: A | Discharge status: Home / Self Care | Payer: Worker's Compensation | Attending: Emergency Medicine | Admitting: Emergency Medicine

## 2020-02-09 ENCOUNTER — Other Ambulatory Visit: Payer: Self-pay

## 2020-02-09 ENCOUNTER — Encounter (HOSPITAL_BASED_OUTPATIENT_CLINIC_OR_DEPARTMENT_OTHER): Payer: Self-pay | Admitting: *Deleted

## 2020-02-09 DIAGNOSIS — S299XXA Unspecified injury of thorax, initial encounter: Secondary | ICD-10-CM | POA: Insufficient documentation

## 2020-02-09 DIAGNOSIS — F17210 Nicotine dependence, cigarettes, uncomplicated: Secondary | ICD-10-CM | POA: Diagnosis not present

## 2020-02-09 DIAGNOSIS — Y939 Activity, unspecified: Secondary | ICD-10-CM | POA: Diagnosis not present

## 2020-02-09 DIAGNOSIS — Y999 Unspecified external cause status: Secondary | ICD-10-CM | POA: Diagnosis not present

## 2020-02-09 DIAGNOSIS — W19XXXA Unspecified fall, initial encounter: Secondary | ICD-10-CM | POA: Insufficient documentation

## 2020-02-09 DIAGNOSIS — I252 Old myocardial infarction: Secondary | ICD-10-CM | POA: Diagnosis not present

## 2020-02-09 DIAGNOSIS — Y929 Unspecified place or not applicable: Secondary | ICD-10-CM | POA: Insufficient documentation

## 2020-02-09 NOTE — ED Notes (Signed)
Fell today approx 1900hrs, fell onto concrete floor , fell face down onto rt breast, has pain under rt breast, mid axillary region, no bruising or discoloration currently noted, states taking deep breath increases pain, sitting up relieves pain some

## 2020-02-09 NOTE — ED Triage Notes (Signed)
C/o fall with right rib injury x 2 hrs ago

## 2020-02-10 MED ORDER — NAPROXEN 250 MG PO TABS
500.0000 mg | ORAL_TABLET | Freq: Once | ORAL | Status: AC
  Administered 2020-02-10: 500 mg via ORAL
  Filled 2020-02-10: qty 2

## 2020-02-10 MED ORDER — OXYCODONE-ACETAMINOPHEN 5-325 MG PO TABS: 1.0000 | ORAL_TABLET | Freq: Four times a day (QID) | ORAL | 0 refills | Status: AC | PRN

## 2020-02-10 NOTE — ED Provider Notes (Signed)
[facility] DEPT [facility] Provider Note: Georgena Spurling, MD, FACEP  CSN: [id number] MRN: [id number] ARRIVAL: 02/09/20 at 2037 ROOM: [place]  Rib Injury   HISTORY OF PRESENT ILLNESS  02/10/20 1:14 AM Kristina Hawkins is a 59 y.o. female who lost her balance and fell about 2 hours prior to arrival.  She fell onto her right side striking her her right chest wall.  She is having sharp, stabbing pain in her right lateral rib area.  She rates her pain is a 7 out of 10, worse with breathing and especially worse with coughing.  She denies other injury.   Past Medical History:  Diagnosis Date  . ANAPHYLAXIS 04/13/2007  . Hypocalcemia 02/08/2010  . HYPOTHYROIDISM, POSTSURGICAL 04/13/2007  . MI (myocardial infarction) ([place]) 05/2011  . 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 Old MI (myocardial infarction) 06/15/2013    History reviewed. No pertinent surgical history.  Family History  Problem Relation Age of Onset  . Thyroid disease Neg Hx     Social History   Tobacco Use  . Smoking status: Current Every Day Smoker    Packs/day: 0.30    Types: Cigarettes  Substance Use Topics  . Alcohol use: Not on file  . Drug use: Not on file    Prior to Admission medications   Medication Sig Start Date End Date Taking? Authorizing Provider  amitriptyline (ELAVIL) 10 MG tablet Take 10 mg by mouth at bedtime.    [provider]  amLODipine (NORVASC) 5 MG tablet TAKE 1 TABLET (5 MG TOTAL) BY MOUTH DAILY. 1 TAB DAILY 07/04/15   Jerline Pain, MD  atorvastatin (LIPITOR) 80 MG tablet TAKE 1 TABLET (80 MG TOTAL) BY MOUTH DAILY. 10/02/14   Jerline Pain, MD  carvedilol (COREG) 6.25 MG tablet  TAKE 1 TABLET WITH FOOD TWICE A DAY ORALLY 04/07/13   Jerline Pain, MD  cetirizine (ZYRTEC) 10 MG tablet Take 10 mg by mouth daily. 1 tab daily 05/18/13   [provider]  chlorzoxazone (PARAFON) 500 MG tablet Take 500 mg by mouth 3 (three) times daily. 1 tab tid  04/19/13   [provider]  EFFIENT 10 MG TABS tablet TAKE 1 TABLET (10 MG TOTAL) BY MOUTH DAILY. 08/14/14   Lendon Colonel, NP  furosemide (LASIX) 20 MG tablet TAKE 1 TABLET (20 MG TOTAL) BY MOUTH DAILY. 03/26/15   Jerline Pain, MD  gabapentin (NEURONTIN) 300 MG capsule Take 300 mg by mouth 3 (three) times daily.  04/06/13   [provider]  KLOR-CON M20 20 MEQ tablet Take 20 mEq by mouth daily. 1 tab daily 06/13/13   [provider]  levothyroxine (SYNTHROID, LEVOTHROID) 200 MCG tablet TAKE 1 TABLET (200 MCG TOTAL) BY MOUTH DAILY. Patient taking differently: TAKE 1 TABLET (200 MCG TOTAL) BY MOUTH DAILY BEFORE BREAKFAST 10/14/13   Renato Shin, MD  montelukast (SINGULAIR) 10 MG tablet Take 10 mg by mouth at bedtime. Take 1 tab a day 06/13/13   [provider]  NITROSTAT 0.3 MG SL tablet Place 0.3 mg under the tongue every 5 (five) minutes as needed.  07/01/11   [provider]  omeprazole (PRILOSEC OTC) 20 MG tablet Take 20 mg by mouth daily.  [provider]  oxyCODONE-acetaminophen (PERCOCET) 5-325 MG tablet Take 1 tablet by mouth every 6 (six) hours as needed (for rib pain). 02/10/20   Terran Klinke, MD  PARoxetine (PAXIL) 40 MG tablet Take 60 mg by mouth every morning. 1 1/2 tablets daily 07/10/11   [provider]    Allergies Beef-derived products, Keflex [cephalexin], Penicillins, and Tetracycline   REVIEW OF SYSTEMS  Negative except as noted here or in the History of Present Illness.   PHYSICAL EXAMINATION  Initial Vital Signs Blood pressure (!) 147/73, pulse 60, temperature 98.8 F (37.1 C), temperature source Oral, resp. rate 19, height 4\' 11"   (1.499 m), weight 104.3 kg, SpO2 100 %.  Examination General: Well-developed, well-nourished female in no acute distress; appearance consistent with age of record HENT: normocephalic; atraumatic Eyes: Normal appearance Neck: supple Heart: regular rate and rhythm Lungs: clear to auscultation bilaterally Chest: Well localized right lower lateral rib tenderness without deformity or crepitus Abdomen: soft; nondistended; nontender; bowel sounds present Extremities: No deformity; full range of motion Neurologic: Awake, alert and oriented; motor function intact in all extremities and symmetric; no facial droop Skin: Warm and dry Psychiatric: Normal mood and affect   RESULTS  Summary of this visit's results, reviewed and interpreted by myself:   EKG Interpretation  Date/Time:    Ventricular Rate:    PR Interval:    QRS Duration:   QT Interval:    QTC Calculation:   R Axis:     Text Interpretation:        Laboratory Studies: No results found for this or any previous visit (from the past 24 hour(s)). Imaging Studies: DG Ribs Unilateral W/Chest Right  Result Date: 02/09/2020 CLINICAL DATA:  59 year old female status post fall tonight with anterior rib pain. Smoker. EXAM: RIGHT RIBS AND CHEST - 3+ VIEW COMPARISON:  Chest radiographs 06/15/2009. FINDINGS: Lung volumes and mediastinal contours remain normal. Diffuse increased pulmonary interstitial markings since 2010, likely the sequelae of smoking. No pneumothorax, pleural effusion or confluent pulmonary opacity. Visualized tracheal air column is within normal limits. Negative visible bowel gas pattern. Two oblique views of the right mid and lower ribs. Rib marker at the anterior right 9/10 rib level. No right rib fracture identified. Underlying thoracolumbar scoliosis. No acute osseous abnormality identified. IMPRESSION: 1. No right rib fracture identified. 2. Increased pulmonary interstitial markings since 2010, likely the sequelae of  smoking. No superimposed acute cardiopulmonary abnormality. Electronically Signed   By: Genevie Ann M.D.   On: 02/09/2020 21:16    ED COURSE and MDM  Nursing notes, initial and subsequent vitals signs, including pulse oximetry, reviewed and interpreted by myself.  Vitals:   02/09/20 2044 02/09/20 2046 02/09/20 2352  BP:  139/78 (!) 147/73  Pulse:  66 60  Resp:  18 19  Temp:  98.8 F (37.1 C)   TempSrc:  Oral   SpO2:  98% 100%  Weight: 104.3 kg    Height: 4\' 11"  (1.499 m)     Medications  naproxen (NAPROSYN) tablet 500 mg (has no administration in time range)    No obvious rib fractures on x-rays but clinically I have a high suspicion of an occult rib fracture given clinical presentation.  We will treat with an incentive spirometer and short course of narcotic.  PROCEDURES  Procedures   ED DIAGNOSES     ICD-10-CM   1. Fall, initial encounter  W19.XXXA   2. Traumatic injury of rib  S29.9XXA  Kharis Lapenna, Jenny Reichmann, MD 02/10/20 [phone]

## 2020-02-12 ENCOUNTER — Emergency Department (HOSPITAL_BASED_OUTPATIENT_CLINIC_OR_DEPARTMENT_OTHER): Payer: Worker's Compensation

## 2020-02-12 ENCOUNTER — Other Ambulatory Visit: Payer: Self-pay

## 2020-02-12 ENCOUNTER — Emergency Department (HOSPITAL_BASED_OUTPATIENT_CLINIC_OR_DEPARTMENT_OTHER)
Admission: EM | Admit: 2020-02-12 | Discharge: 2020-02-12 | Disposition: A | Discharge status: Home / Self Care | Payer: Worker's Compensation | Attending: Emergency Medicine | Admitting: Emergency Medicine

## 2020-02-12 ENCOUNTER — Encounter (HOSPITAL_BASED_OUTPATIENT_CLINIC_OR_DEPARTMENT_OTHER): Payer: Self-pay

## 2020-02-12 DIAGNOSIS — F17210 Nicotine dependence, cigarettes, uncomplicated: Secondary | ICD-10-CM | POA: Insufficient documentation

## 2020-02-12 DIAGNOSIS — W010XXA Fall on same level from slipping, tripping and stumbling without subsequent striking against object, initial encounter: Secondary | ICD-10-CM | POA: Insufficient documentation

## 2020-02-12 DIAGNOSIS — E039 Hypothyroidism, unspecified: Secondary | ICD-10-CM | POA: Diagnosis not present

## 2020-02-12 DIAGNOSIS — Y939 Activity, unspecified: Secondary | ICD-10-CM | POA: Diagnosis not present

## 2020-02-12 DIAGNOSIS — I10 Essential (primary) hypertension: Secondary | ICD-10-CM | POA: Insufficient documentation

## 2020-02-12 DIAGNOSIS — Y999 Unspecified external cause status: Secondary | ICD-10-CM | POA: Diagnosis not present

## 2020-02-12 DIAGNOSIS — I2510 Atherosclerotic heart disease of native coronary artery without angina pectoris: Secondary | ICD-10-CM | POA: Diagnosis not present

## 2020-02-12 DIAGNOSIS — S299XXA Unspecified injury of thorax, initial encounter: Secondary | ICD-10-CM | POA: Diagnosis present

## 2020-02-12 DIAGNOSIS — E669 Obesity, unspecified: Secondary | ICD-10-CM | POA: Diagnosis not present

## 2020-02-12 DIAGNOSIS — Y929 Unspecified place or not applicable: Secondary | ICD-10-CM | POA: Insufficient documentation

## 2020-02-12 DIAGNOSIS — Z79899 Other long term (current) drug therapy: Secondary | ICD-10-CM | POA: Diagnosis not present

## 2020-02-12 DIAGNOSIS — S2231XD Fracture of one rib, right side, subsequent encounter for fracture with routine healing: Secondary | ICD-10-CM | POA: Diagnosis not present

## 2020-02-12 MED ORDER — IBUPROFEN 600 MG PO TABS: 600.0000 mg | ORAL_TABLET | Freq: Four times a day (QID) | ORAL | 0 refills | Status: AC | PRN

## 2020-02-12 NOTE — Discharge Instructions (Signed)
Your x-ray demonstrate a broken right rib.  Please continue to use incentive spirometer multiple times daily to decrease risk of developing pneumonia.  Take ibuprofen as needed for pain.  Follow instruction below.

## 2020-02-12 NOTE — ED Provider Notes (Signed)
[facility] EMERGENCY DEPARTMENT Provider Note   CSN: [id number] Arrival date & time: 02/12/20  1713     History Chief Complaint  Patient presents with  . Follow-up    Kristina Hawkins is a 59 y.o. female.  The history is provided by the patient and medical records. No language interpreter was used.     59 year old female recently injured her ribs from a fall presenting complaining of chest wall pain.  Patient report 4 days ago she tripped, fell, landed on right side of her chest against a hard surface.  She developed acute onset of pain to the affected area.  She was seen in the ED for her symptoms, and x-ray of her chest was obtained but no obvious signs of broken ribs were noted.  Patient however was prescribed an incentive spirometer as well as pain medication for her symptoms for suspected occult rib fracture.  She now mention worsening pain to the same area, sharp, painful when breathing or laughing, nonradiating, and now she is hearing popping and shifting sounds to the affected area.  She has not noticed any bruising to the affected area she denies any increase shortness of breath or hemoptysis.  She is a smoker.  Past Medical History:  Diagnosis Date  . ANAPHYLAXIS 04/13/2007  . Hypocalcemia 02/08/2010  . HYPOTHYROIDISM, POSTSURGICAL 04/13/2007  . MI (myocardial infarction) ([place]) 05/2011  . 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 Old MI (myocardial infarction) 06/15/2013    Patient Active Problem List   Diagnosis Date Noted  . Coronary atherosclerosis of native coronary artery 06/15/2013  . Old MI (myocardial infarction) 06/15/2013  . HTN (hypertension) 06/15/2013  . Urolithiasis  08/12/2011  . Hypocalcemia 02/08/2010  . NEOPLASM, MALIGNANT, THYROID GLAND 04/13/2007  . HYPOTHYROIDISM, POSTSURGICAL 04/13/2007  . ANAPHYLAXIS 04/13/2007    History reviewed. No pertinent surgical history.   OB History   No obstetric history on file.     Family History  Problem Relation Age of Onset  . Thyroid disease Neg Hx     Social History   Tobacco Use  . Smoking status: Current Every Day Smoker    Packs/day: 0.30    Types: Cigarettes  . Smokeless tobacco: Never Used  Vaping Use  . Vaping Use: Never used  Substance Use Topics  . Alcohol use: Not on file  . Drug use: Not on file    Home Medications Prior to Admission medications   Medication Sig Start Date End Date Taking? Authorizing Provider  amitriptyline (ELAVIL) 10 MG tablet Take 10 mg by mouth at bedtime.    [provider]  amLODipine (NORVASC) 5 MG tablet TAKE 1 TABLET (5 MG TOTAL) BY MOUTH DAILY. 1 TAB DAILY 07/04/15   Jerline Pain, MD  atorvastatin (LIPITOR) 80 MG tablet TAKE 1 TABLET (80 MG TOTAL) BY MOUTH DAILY. 10/02/14   Jerline Pain, MD  carvedilol (COREG) 6.25 MG tablet TAKE 1 TABLET WITH FOOD TWICE A DAY ORALLY 04/07/13   Jerline Pain, MD  cetirizine (ZYRTEC) 10 MG tablet Take 10 mg by mouth daily. 1 tab daily 05/18/13   [provider]  chlorzoxazone (PARAFON) 500 MG tablet Take 500 mg by mouth 3 (three) times daily. 1 tab tid  04/19/13   [provider]  EFFIENT 10 MG TABS tablet TAKE 1 TABLET (10 MG TOTAL) BY MOUTH DAILY. 08/14/14   Lendon Colonel, NP  furosemide (LASIX) 20 MG tablet TAKE 1 TABLET (20 MG TOTAL) BY MOUTH DAILY. 03/26/15   Jerline Pain, MD  gabapentin (NEURONTIN) 300 MG capsule Take 300 mg by mouth 3 (three) times daily.  04/06/13   [provider]  KLOR-CON M20 20 MEQ tablet Take 20 mEq by mouth daily. 1 tab daily 06/13/13   [provider]  levothyroxine (SYNTHROID, LEVOTHROID) 200 MCG tablet TAKE 1 TABLET (200 MCG TOTAL) BY  MOUTH DAILY. Patient taking differently: TAKE 1 TABLET (200 MCG TOTAL) BY MOUTH DAILY BEFORE BREAKFAST 10/14/13   Renato Shin, MD  montelukast (SINGULAIR) 10 MG tablet Take 10 mg by mouth at bedtime. Take 1 tab a day 06/13/13   [provider]  NITROSTAT 0.3 MG SL tablet Place 0.3 mg under the tongue every 5 (five) minutes as needed.  07/01/11   [provider]  omeprazole (PRILOSEC OTC) 20 MG tablet Take 20 mg by mouth daily.    [provider]  oxyCODONE-acetaminophen (PERCOCET) 5-325 MG tablet Take 1 tablet by mouth every 6 (six) hours as needed (for rib pain). 02/10/20   Molpus, John, MD  PARoxetine (PAXIL) 40 MG tablet Take 60 mg by mouth every morning. 1 1/2 tablets daily 07/10/11   [provider]    Allergies    Beef-derived products, Keflex [cephalexin], Penicillins, and Tetracycline  Review of Systems   Review of Systems  Respiratory: Negative for shortness of breath.   Cardiovascular: Positive for chest pain.  All other systems reviewed and are negative.   Physical Exam Updated Vital Signs BP (!) 170/68 (BP Location: Left Wrist)   Pulse 66   Temp 97.7 F (36.5 C)   Resp 20   Ht 4\' 11"  (1.499 m)   Wt 104.3 kg   SpO2 99%   BMI 46.45 kg/m   Physical Exam Vitals and nursing note reviewed.  Constitutional:      General: She is not in acute distress.    Appearance: She is well-developed. She is obese.  HENT:     Head: Atraumatic.  Eyes:     Conjunctiva/sclera: Conjunctivae normal.  Cardiovascular:     Rate and Rhythm: Normal rate and regular rhythm.     Pulses: Normal pulses.     Heart sounds: Normal heart sounds.  Pulmonary:     Effort: Pulmonary effort is normal.     Breath sounds: Normal breath sounds.  Chest:     Chest wall: Tenderness (Tenderness to right anterior lateral chest wall at the level of the eighth ribs without any crepitus emphysema appreciated.) present.  Abdominal:     Palpations: Abdomen is soft.    Musculoskeletal:     Cervical back: Neck supple.  Skin:    Findings: No rash.  Neurological:     Mental Status: She is alert and oriented to person, place, and time.  Psychiatric:        Mood and Affect: Mood normal.     ED Results / Procedures / Treatments   Labs (all labs ordered are listed, but only abnormal results are displayed) Labs Reviewed - No data to display  EKG None  Radiology DG Ribs Unilateral W/Chest Right  Result Date: 02/12/2020 CLINICAL DATA:  Fall with right rib pain. Fall 4 days ago, increasing pain. EXAM: RIGHT RIBS AND CHEST - 3+ VIEW COMPARISON:  Right rib series  3 days prior 02/09/2020 FINDINGS: Minimally displaced fracture of right anterior sixth rib, not well seen on prior exam due to osseous and soft tissue overlap. There is no evidence of pneumothorax or pleural effusion. Similar peribronchial and interstitial thickening to prior. Subsegmental atelectasis at the left lung base, new from prior. Heart size and mediastinal contours are within normal limits. IMPRESSION: 1. Minimally displaced right anterior sixth rib fracture, not well seen on prior exam due to osseous and soft tissue overlap. 2. No pneumothorax or pulmonary complication. 3. Increasing subsegmental atelectasis at the left lung base over the past few days. Electronically Signed   By: Keith Rake M.D.   On: 02/12/2020 19:27    Procedures Procedures (including critical care time)  Medications Ordered in ED Medications - No data to display  ED Course  I have reviewed the triage vital signs and the nursing notes.  Pertinent labs & imaging results that were available during my care of the patient were reviewed by me and considered in my medical decision making (see chart for details).    MDM Rules/Calculators/A&P                          BP (!) 170/68 (BP Location: Left Wrist)   Pulse 66   Temp 97.7 F (36.5 C)   Resp 20   Ht 4\' 11"  (1.499 m)   Wt 104.3 kg   SpO2 99%   BMI  46.45 kg/m   Final Clinical Impression(s) / ED Diagnoses Final diagnoses:  Closed fracture of one rib of right side with routine healing, subsequent encounter    Rx / DC Orders ED Discharge Orders         Ordered    ibuprofen (ADVIL) 600 MG tablet  Every 6 hours PRN     Discontinue  Reprint     02/12/20 1944         7:07 PM Patient here with chest wall pain after a fall several days prior.  I suspect this is likely to be a rib fracture.  Will obtain ribs x-ray to rule out collapsed lung or traumatic pneumothorax.  7:42 PM X-ray demonstrate a minimally displaced right anterior sixth rib fracture but no evidence of pneumothorax or pulmonary complication.  Increasing subsegmental atelectasis in the left lung base over the past few days.  I discussed this finding with patient.  Encourage patient to continue to use incentive spirometer.  I will provide additional pain medication for symptom control.  I gave return precaution.   Domenic Moras, PA-C 02/12/20 1945    Truddie Hidden, MD 02/12/20 2104

## 2020-02-12 NOTE — ED Triage Notes (Signed)
Pt was evaluated in this ED on 8/12 for a rib injury Pt states they did not see a clear rib fx, but they were unsure. Today pt's pain is worse and pt feels some "popping and grinding" to her R chest wall.

## 2020-02-14 DIAGNOSIS — R05 Cough: Secondary | ICD-10-CM | POA: Diagnosis not present

## 2020-02-14 DIAGNOSIS — S2239XD Fracture of one rib, unspecified side, subsequent encounter for fracture with routine healing: Secondary | ICD-10-CM | POA: Diagnosis not present

## 2020-02-14 DIAGNOSIS — Z20822 Contact with and (suspected) exposure to covid-19: Secondary | ICD-10-CM | POA: Diagnosis not present

## 2020-02-21 DIAGNOSIS — S2239XD Fracture of one rib, unspecified side, subsequent encounter for fracture with routine healing: Secondary | ICD-10-CM | POA: Diagnosis not present

## 2020-07-24 DIAGNOSIS — Z03818 Encounter for observation for suspected exposure to other biological agents ruled out: Secondary | ICD-10-CM | POA: Diagnosis not present

## 2020-12-04 DIAGNOSIS — E039 Hypothyroidism, unspecified: Secondary | ICD-10-CM | POA: Diagnosis not present

## 2020-12-04 DIAGNOSIS — E559 Vitamin D deficiency, unspecified: Secondary | ICD-10-CM | POA: Diagnosis not present

## 2020-12-04 DIAGNOSIS — I10 Essential (primary) hypertension: Secondary | ICD-10-CM | POA: Diagnosis not present

## 2020-12-04 DIAGNOSIS — Z862 Personal history of diseases of the blood and blood-forming organs and certain disorders involving the immune mechanism: Secondary | ICD-10-CM | POA: Diagnosis not present

## 2020-12-04 DIAGNOSIS — M2550 Pain in unspecified joint: Secondary | ICD-10-CM | POA: Diagnosis not present

## 2020-12-04 DIAGNOSIS — K219 Gastro-esophageal reflux disease without esophagitis: Secondary | ICD-10-CM | POA: Diagnosis not present

## 2020-12-04 DIAGNOSIS — E7800 Pure hypercholesterolemia, unspecified: Secondary | ICD-10-CM | POA: Diagnosis not present

## 2020-12-11 DIAGNOSIS — E538 Deficiency of other specified B group vitamins: Secondary | ICD-10-CM | POA: Diagnosis not present

## 2021-01-08 DIAGNOSIS — E538 Deficiency of other specified B group vitamins: Secondary | ICD-10-CM | POA: Diagnosis not present

## 2021-02-05 DIAGNOSIS — D510 Vitamin B12 deficiency anemia due to intrinsic factor deficiency: Secondary | ICD-10-CM | POA: Diagnosis not present

## 2021-03-12 DIAGNOSIS — E538 Deficiency of other specified B group vitamins: Secondary | ICD-10-CM | POA: Diagnosis not present

## 2021-04-09 DIAGNOSIS — E538 Deficiency of other specified B group vitamins: Secondary | ICD-10-CM | POA: Diagnosis not present

## 2021-05-14 DIAGNOSIS — E538 Deficiency of other specified B group vitamins: Secondary | ICD-10-CM | POA: Diagnosis not present

## 2021-06-11 DIAGNOSIS — E039 Hypothyroidism, unspecified: Secondary | ICD-10-CM | POA: Diagnosis not present

## 2021-06-11 DIAGNOSIS — E559 Vitamin D deficiency, unspecified: Secondary | ICD-10-CM | POA: Diagnosis not present

## 2021-06-11 DIAGNOSIS — E611 Iron deficiency: Secondary | ICD-10-CM | POA: Diagnosis not present

## 2021-06-11 DIAGNOSIS — E538 Deficiency of other specified B group vitamins: Secondary | ICD-10-CM | POA: Diagnosis not present

## 2021-06-11 DIAGNOSIS — Z23 Encounter for immunization: Secondary | ICD-10-CM | POA: Diagnosis not present

## 2021-06-11 DIAGNOSIS — Z131 Encounter for screening for diabetes mellitus: Secondary | ICD-10-CM | POA: Diagnosis not present

## 2021-07-03 DIAGNOSIS — U071 COVID-19: Secondary | ICD-10-CM | POA: Diagnosis not present

## 2022-08-01 IMAGING — DX DG RIBS W/ CHEST 3+V*R*
3 series · 3 of 3 positions shown · non-contrast
Comparison: Chest radiographs 06/15/2009.

CLINICAL DATA: 59-year-old female status post fall tonight with
anterior rib pain. Smoker.

EXAM:
RIGHT RIBS AND CHEST - 3+ VIEW

[chest pa]
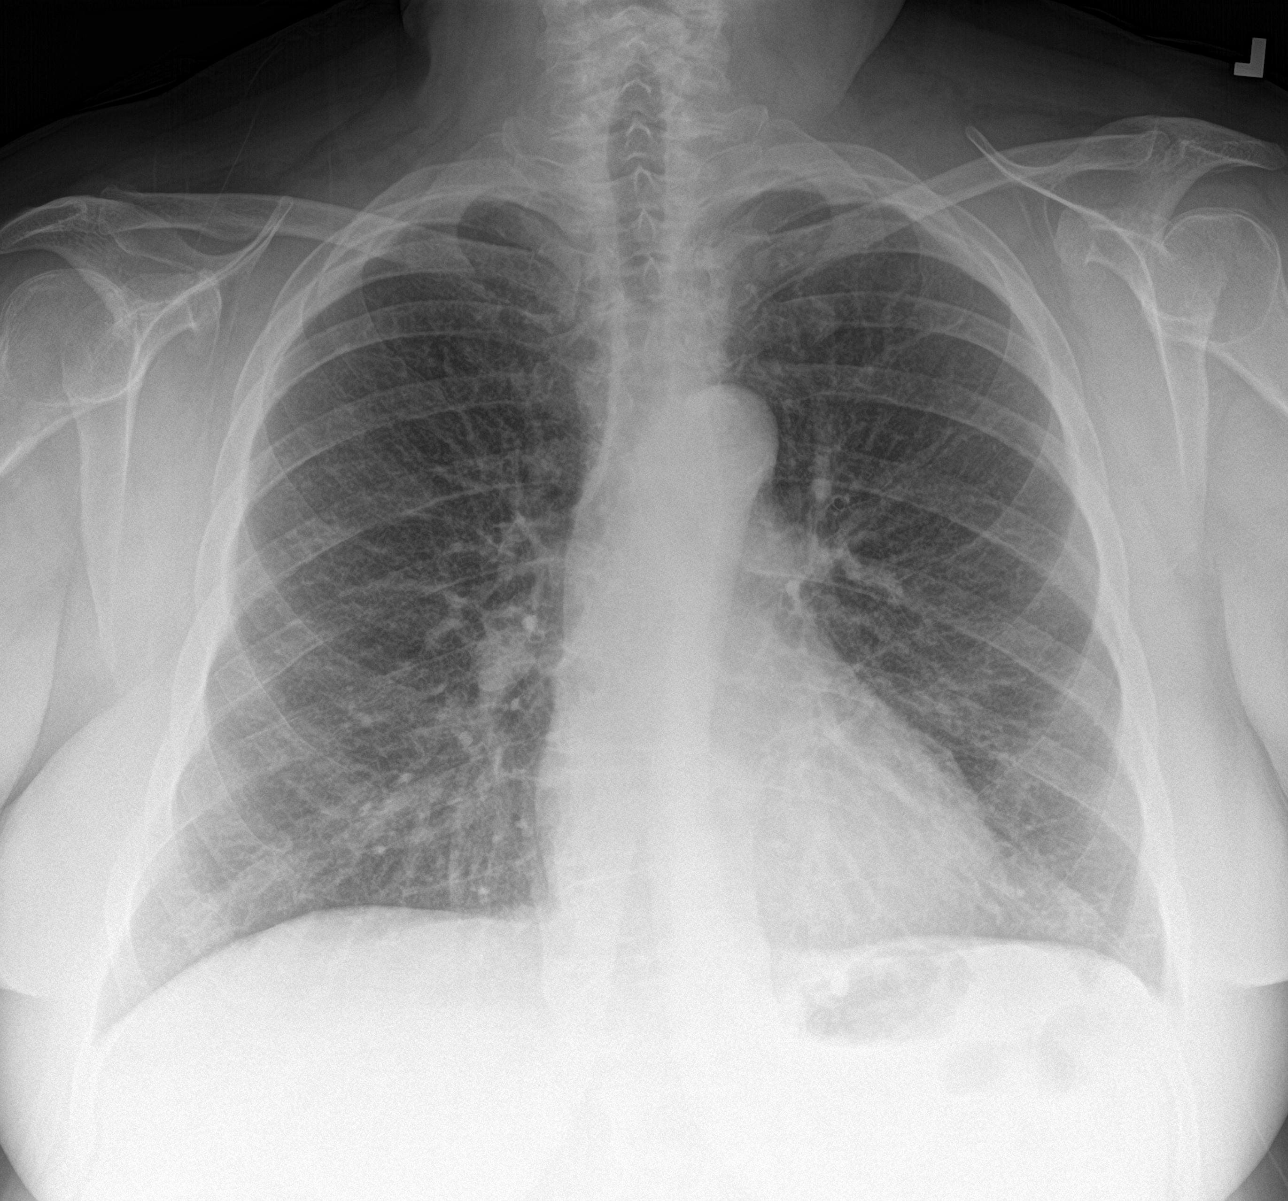

[rib pa]
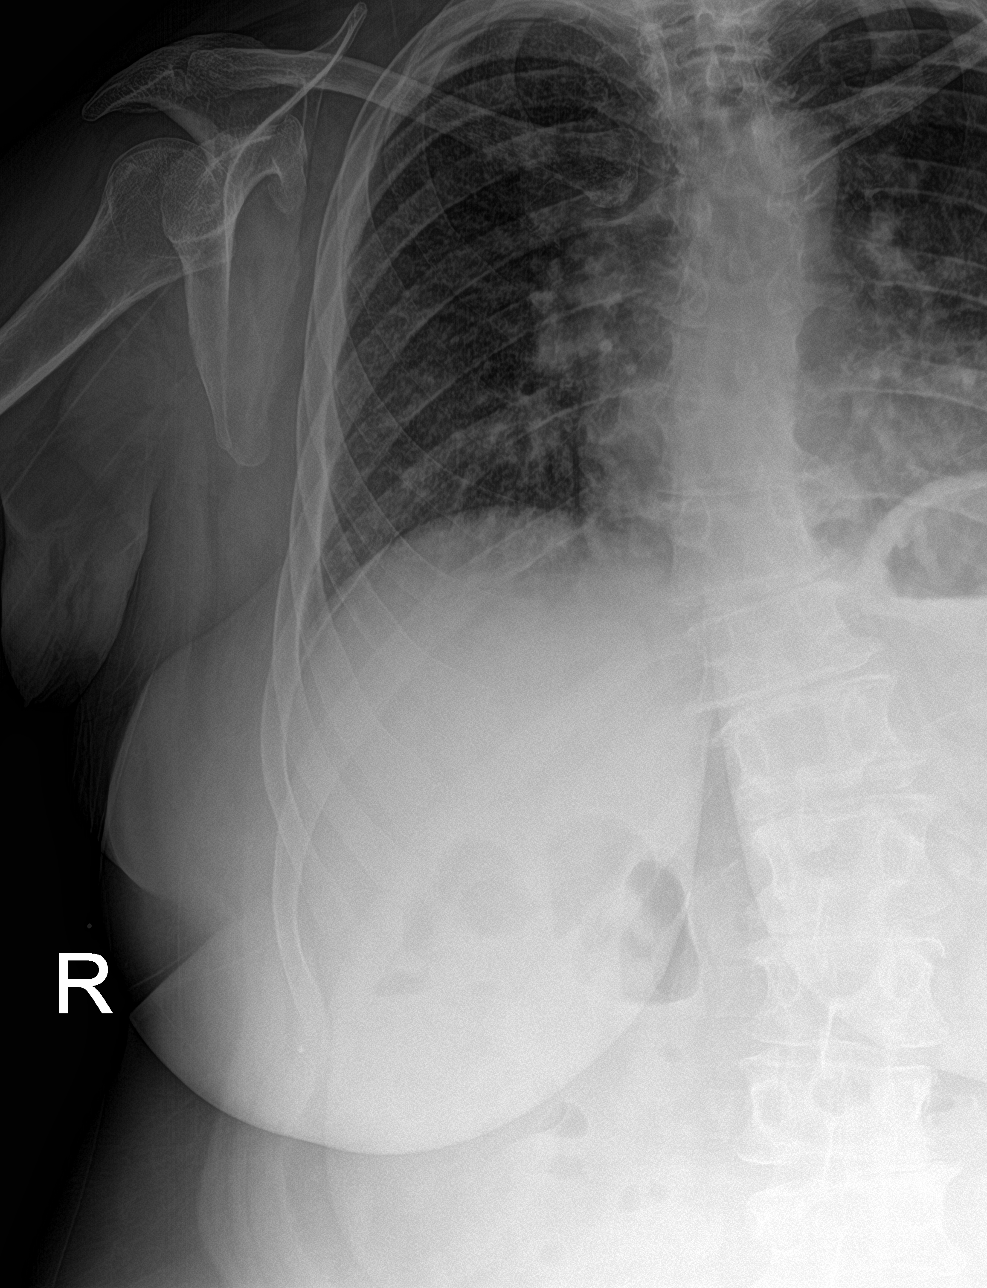

[rib pa obl]
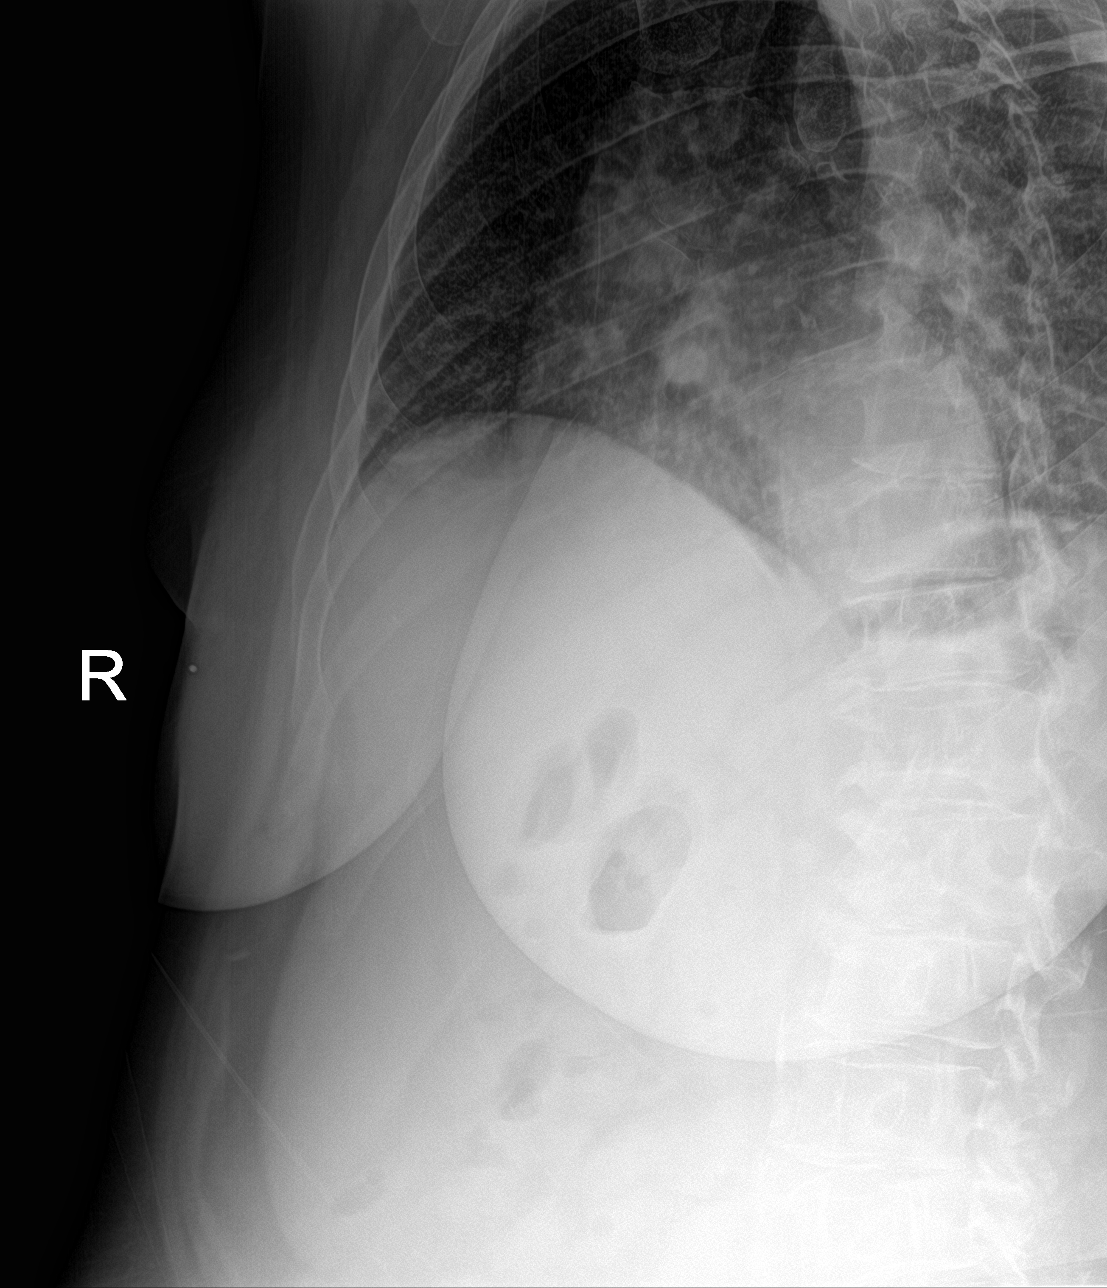

[3 of 3 positions shown; findings below may reference images not displayed]

FINDINGS: Lung volumes and mediastinal contours remain normal. Diffuse
increased pulmonary interstitial markings since 0060, likely the
sequelae of smoking.

No pneumothorax, pleural effusion or confluent pulmonary opacity.
Visualized tracheal air column is within normal limits. Negative
visible bowel gas pattern.

Two oblique views of the right mid and lower ribs. Rib marker at the
anterior right [DATE] rib level. No right rib fracture identified.
Underlying thoracolumbar scoliosis. No acute osseous abnormality
identified.
IMPRESSION: 1. No right rib fracture identified.
2. Increased pulmonary interstitial markings since 0060, likely the
sequelae of smoking. No superimposed acute cardiopulmonary
abnormality.
